# Patient Record
Sex: Male | Born: 1980
Health system: Southern US, Community
[De-identification: ages and names within clinical notes are randomized; demographics above are authoritative.]

## PROBLEM LIST (undated history)

## (undated) DIAGNOSIS — T7840XA Allergy, unspecified, initial encounter: Secondary | ICD-10-CM

## (undated) HISTORY — DX: Allergy, unspecified, initial encounter: T78.40XA

---

## 2003-05-18 ENCOUNTER — Emergency Department (HOSPITAL_COMMUNITY): Admission: EM | Admit: 2003-05-18 | Discharge: 2003-05-18 | Payer: Self-pay | Admitting: Emergency Medicine

## 2004-10-31 ENCOUNTER — Emergency Department (HOSPITAL_COMMUNITY): Admission: EM | Admit: 2004-10-31 | Discharge: 2004-10-31 | Payer: Self-pay | Admitting: Emergency Medicine

## 2007-09-22 ENCOUNTER — Emergency Department (HOSPITAL_COMMUNITY): Admission: EM | Admit: 2007-09-22 | Discharge: 2007-09-22 | Payer: Self-pay | Admitting: Family Medicine

## 2008-07-07 ENCOUNTER — Emergency Department (HOSPITAL_COMMUNITY): Admission: EM | Admit: 2008-07-07 | Discharge: 2008-07-07 | Payer: Self-pay | Admitting: Family Medicine

## 2010-02-14 ENCOUNTER — Emergency Department (HOSPITAL_COMMUNITY)
Admission: EM | Admit: 2010-02-14 | Discharge: 2010-02-14 | Disposition: A | Discharge status: Home / Self Care | Payer: Self-pay | Attending: Emergency Medicine | Admitting: Emergency Medicine

## 2010-02-14 DIAGNOSIS — J029 Acute pharyngitis, unspecified: Secondary | ICD-10-CM | POA: Insufficient documentation

## 2010-02-14 DIAGNOSIS — H9209 Otalgia, unspecified ear: Secondary | ICD-10-CM | POA: Insufficient documentation

## 2010-02-14 DIAGNOSIS — R509 Fever, unspecified: Secondary | ICD-10-CM | POA: Insufficient documentation

## 2010-02-14 DIAGNOSIS — R51 Headache: Secondary | ICD-10-CM | POA: Insufficient documentation

## 2010-02-14 DIAGNOSIS — H6690 Otitis media, unspecified, unspecified ear: Secondary | ICD-10-CM | POA: Insufficient documentation

## 2010-07-30 ENCOUNTER — Emergency Department (HOSPITAL_COMMUNITY)
Admission: EM | Admit: 2010-07-30 | Discharge: 2010-07-30 | Disposition: A | Discharge status: Home / Self Care | Payer: Self-pay | Attending: Emergency Medicine | Admitting: Emergency Medicine

## 2010-07-30 DIAGNOSIS — J020 Streptococcal pharyngitis: Secondary | ICD-10-CM | POA: Insufficient documentation

## 2010-07-30 DIAGNOSIS — J351 Hypertrophy of tonsils: Secondary | ICD-10-CM | POA: Insufficient documentation

## 2010-07-30 LAB — RAPID STREP SCREEN (MED CTR MEBANE ONLY): Streptococcus, Group A Screen (Direct): POSITIVE — AB

## 2013-09-12 ENCOUNTER — Emergency Department (HOSPITAL_COMMUNITY)
Admission: EM | Admit: 2013-09-12 | Discharge: 2013-09-12 | Disposition: A | Discharge status: Home / Self Care | Payer: No Typology Code available for payment source | Attending: Emergency Medicine | Admitting: Emergency Medicine

## 2013-09-12 ENCOUNTER — Encounter (HOSPITAL_COMMUNITY): Payer: Self-pay | Admitting: Emergency Medicine

## 2013-09-12 ENCOUNTER — Emergency Department (HOSPITAL_COMMUNITY): Payer: No Typology Code available for payment source

## 2013-09-12 DIAGNOSIS — F17200 Nicotine dependence, unspecified, uncomplicated: Secondary | ICD-10-CM | POA: Diagnosis not present

## 2013-09-12 DIAGNOSIS — Y9289 Other specified places as the place of occurrence of the external cause: Secondary | ICD-10-CM | POA: Diagnosis not present

## 2013-09-12 DIAGNOSIS — Y9389 Activity, other specified: Secondary | ICD-10-CM | POA: Insufficient documentation

## 2013-09-12 DIAGNOSIS — W1809XA Striking against other object with subsequent fall, initial encounter: Secondary | ICD-10-CM | POA: Insufficient documentation

## 2013-09-12 DIAGNOSIS — S20211A Contusion of right front wall of thorax, initial encounter: Secondary | ICD-10-CM

## 2013-09-12 DIAGNOSIS — S20219A Contusion of unspecified front wall of thorax, initial encounter: Secondary | ICD-10-CM | POA: Diagnosis not present

## 2013-09-12 DIAGNOSIS — S298XXA Other specified injuries of thorax, initial encounter: Secondary | ICD-10-CM | POA: Insufficient documentation

## 2013-09-12 MED ORDER — OXYCODONE-ACETAMINOPHEN 5-325 MG PO TABS
1.0000 | ORAL_TABLET | Freq: Once | ORAL | Status: AC
  Administered 2013-09-12: 1 via ORAL
  Filled 2013-09-12: qty 1

## 2013-09-12 MED ORDER — HYDROCODONE-ACETAMINOPHEN 5-325 MG PO TABS
1.0000 | ORAL_TABLET | Freq: Four times a day (QID) | ORAL | Status: DC | PRN
Start: 2013-09-12 — End: 2017-12-11

## 2013-09-12 MED ORDER — IBUPROFEN 800 MG PO TABS: 800.0000 mg | ORAL_TABLET | Freq: Three times a day (TID) | ORAL | Status: DC

## 2013-09-12 NOTE — ED Notes (Signed)
MD at bedside. 

## 2013-09-12 NOTE — ED Notes (Signed)
Pt discharged to home. D/c instructions given, rx's given, no questions verbalized. Vitals stable.

## 2013-09-12 NOTE — ED Notes (Signed)
He states he fell approx 18 feet off his 18 wheeler truck yesterday morning in [place]. He drove home from [place] and noticed increasing pain in his R rib cage since. He states it hurts worse every time he breathes. He denies any other injuries. hes ambulatory, mae, breathing easily

## 2013-09-12 NOTE — Progress Notes (Signed)
Orthopedic Tech Progress Note Patient Details:  Logan Clark 23-Jun-1980 [id number]  Ortho Devices Type of Ortho Device: Arm sling Ortho Device/Splint Location: rue Ortho Device/Splint Interventions: Application   Nikki Dom 09/12/2013, 2:58 PM

## 2013-09-12 NOTE — Discharge Instructions (Signed)
Ibuprofen and norco for pain. Rest. Ice. Use incentive spirometer every 2-3 hrs. Follow up with primary care doctor.   Rib Contusion A rib contusion (bruise) can occur by a blow to the chest or by a fall against a hard object. Usually these will be much better in a couple weeks. If X-rays were taken today and there are no broken bones (fractures), the diagnosis of bruising is made. However, broken ribs may not show up for several days, or may be discovered later on a routine X-ray when signs of healing show up. If this happens to you, it does not mean that something was missed on the X-ray, but simply that it did not show up on the first X-rays. Earlier diagnosis will not usually change the treatment. HOME CARE INSTRUCTIONS   Avoid strenuous activity. Be careful during activities and avoid bumping the injured ribs. Activities that pull on the injured ribs and cause pain should be avoided, if possible.  For the first day or two, an ice pack used every 20 minutes while awake may be helpful. Put ice in a plastic bag and put a towel between the bag and the skin.  Eat a normal, well-balanced diet. Drink plenty of fluids to avoid constipation.  Take deep breaths several times a day to keep lungs free of infection. Try to cough several times a day. Splint the injured area with a pillow while coughing to ease pain. Coughing can help prevent pneumonia.  Wear a rib belt or binder only if told to do so by your caregiver. If you are wearing a rib belt or binder, you must do the breathing exercises as directed by your caregiver. If not used properly, rib belts or binders restrict breathing which can lead to pneumonia.  Only take over-the-counter or prescription medicines for pain, discomfort, or fever as directed by your caregiver. SEEK MEDICAL CARE IF:   You or your child has an oral temperature above 102 F (38.9 C).  Your baby is older than 3 months with a rectal temperature of 100.5 F (38.1 C) or  higher for more than 1 day.  You develop a cough, with thick or bloody sputum. SEEK IMMEDIATE MEDICAL CARE IF:   You have difficulty breathing.  You feel sick to your stomach (nausea), have vomiting or belly (abdominal) pain.  You have worsening pain, not controlled with medications, or there is a change in the location of the pain.  You develop sweating or radiation of the pain into the arms, jaw or shoulders, or become light headed or faint.  You or your child has an oral temperature above 102 F (38.9 C), not controlled by medicine.  Your or your baby is older than 3 months with a rectal temperature of 102 F (38.9 C) or higher.  Your baby is 74 months old or younger with a rectal temperature of 100.4 F (38 C) or higher. MAKE SURE YOU:   Understand these instructions.  Will watch your condition.  Will get help right away if you are not doing well or get worse. Document Released: 09/12/2000 Document Revised: 04/14/2012 Document Reviewed: 08/06/2007 [facility] Patient Information 2015 [place], [place]. This information is not intended to replace advice given to you by your health care provider. Make sure you discuss any questions you have with your health care provider.

## 2013-09-12 NOTE — ED Provider Notes (Signed)
CSN: [id number]     Arrival date & time 09/12/13  1131 History   First MD Initiated Contact with Patient 09/12/13 1236     Chief Complaint  Patient presents with  . Fall     (Consider location/radiation/quality/duration/timing/severity/associated sxs/prior Treatment) HPI JONI NORROD is a 33 y.o. male who presents to ED with complaint of right chest injury. Pt states he was loading something on his tractor trailer when he slipped and fell forward, striking right chest on a metal bar that was on the ground. States fell about 8-10 ft. States this happened in [place], drove from [place] here. States pain is worsened with palpation of the chest, movement, deep breathing. Nothing makes it better. Did not take any medications. Denies shortness of breath, just states it is painful to breath.   History reviewed. No pertinent past medical history. History reviewed. No pertinent past surgical history. History reviewed. No pertinent family history. History  Substance Use Topics  . Smoking status: Current Every Day Smoker    Types: Cigarettes  . Smokeless tobacco: Not on file  . Alcohol Use: Yes    Review of Systems  Constitutional: Negative for fever and chills.  Respiratory: Negative for cough, chest tightness and shortness of breath.   Cardiovascular: Positive for chest pain. Negative for palpitations and leg swelling.  Gastrointestinal: Negative for nausea, vomiting, abdominal pain, diarrhea and abdominal distention.  Genitourinary: Negative for hematuria.  Musculoskeletal: Negative for arthralgias, myalgias, neck pain and neck stiffness.  Skin: Negative for rash.  Allergic/Immunologic: Negative for immunocompromised state.  Neurological: Negative for dizziness, weakness, light-headedness, numbness and headaches.      Allergies  Review of patient's allergies indicates no known allergies.  Home Medications   Prior to Admission medications   Not on File   BP 119/83  Pulse 89   Temp(Src) 97.5 F (36.4 C) (Axillary)  Resp 20  Ht  (1.778 m)  Wt 180 lb (81.647 kg)  BMI 25.83 kg/m2  SpO2 100% Physical Exam  Nursing note and vitals reviewed. Constitutional: He appears well-developed and well-nourished. No distress.  HENT:  Head: Normocephalic and atraumatic.  Eyes: Conjunctivae are normal.  Neck: Neck supple.  Cardiovascular: Normal rate, regular rhythm and normal heart sounds.   Pulmonary/Chest: Effort normal. No respiratory distress. He has no wheezes. He has no rales. He exhibits tenderness.  No bruising, swelling, deformity, crepitus over the chest wall. Tenderness over right upper anterior chest, over clavicle and right pectoralis muscle and ribs 1-5  Abdominal: Soft. Bowel sounds are normal. He exhibits no distension. There is no tenderness. There is no rebound.  Musculoskeletal: He exhibits no edema.  Neurological: He is alert.  Skin: Skin is warm and dry.    ED Course  Procedures (including critical care time) Labs Review Labs Reviewed - No data to display  Imaging Review Dg Ribs Unilateral W/chest Right  09/12/2013   CLINICAL DATA:  Right upper chest pain.  EXAM: RIGHT RIBS AND CHEST - 3+ VIEW  COMPARISON:  None  FINDINGS: No fracture or other bone lesions are seen involving the ribs. There is no evidence of pneumothorax or pleural effusion. Both lungs are clear. Heart size and mediastinal contours are within normal limits.  IMPRESSION: Negative.   Electronically Signed   By: Signa Kell M.D.   On: 09/12/2013 13:31     EKG Interpretation None      MDM   Final diagnoses:  Contusion of ribs, right, initial encounter    Patient with  right chest wall tenderness and pain after falling down on a metal bar from history of a truck. This happened yesterday. He drove himself from [place] after the injury. He is complaining of pain to that area, pain with deep breathing, movement, palpation. X-rays obtained of his right ribs and chest both  negative. Most likely chest wall contusion. Patient does have pain with movement of his right shoulder, suspect due to contusion to his pectoralis muscle. Placed into a sling. Home with ibuprofen, norco, PCP follow up. Incentive spirometer provided.    Filed Vitals:   09/12/13 1138 09/12/13 1523  BP: 119/83 114/71  Pulse: 89 47  Temp: 97.5 F (36.4 C) 97.7 F (36.5 C)  TempSrc: Axillary Oral  Resp: 20 18  Height:  (1.778 m)   Weight: 180 lb (81.647 kg)   SpO2: 100% 100%      Min Collymore A Deneane Stifter, PA-C 09/12/13 1604

## 2013-09-13 NOTE — ED Provider Notes (Signed)
Medical screening examination/treatment/procedure(s) were conducted as a shared visit with non-physician practitioner(s) and myself.  I personally evaluated the patient during the encounter.  Pt s/p fall, contusion to right chest wall. No sob. No abd pain. abd soft nt. Spine nt.    Suzi Roots, MD 09/13/13 [phone]

## 2015-09-24 IMAGING — CR DG RIBS W/ CHEST 3+V*R*
4 series · 4 of 4 positions shown · non-contrast
Comparison: None

CLINICAL DATA: Right upper chest pain.

EXAM:
RIGHT RIBS AND CHEST - 3+ VIEW

[w chest pa]
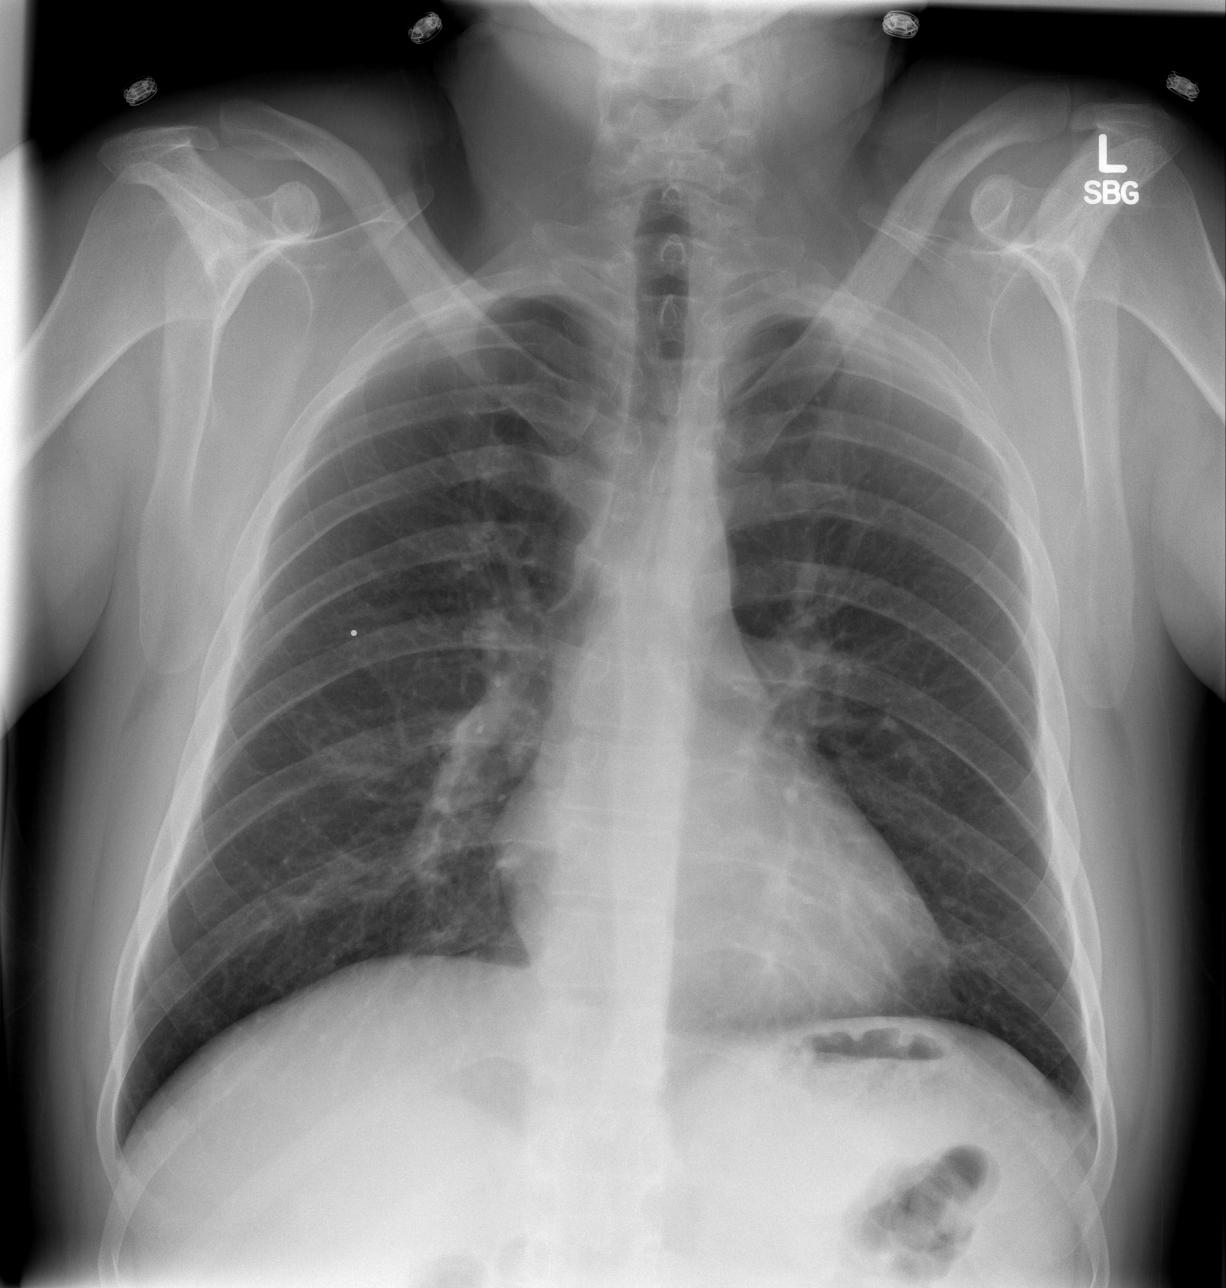

[w ribs ap/pa upper right]
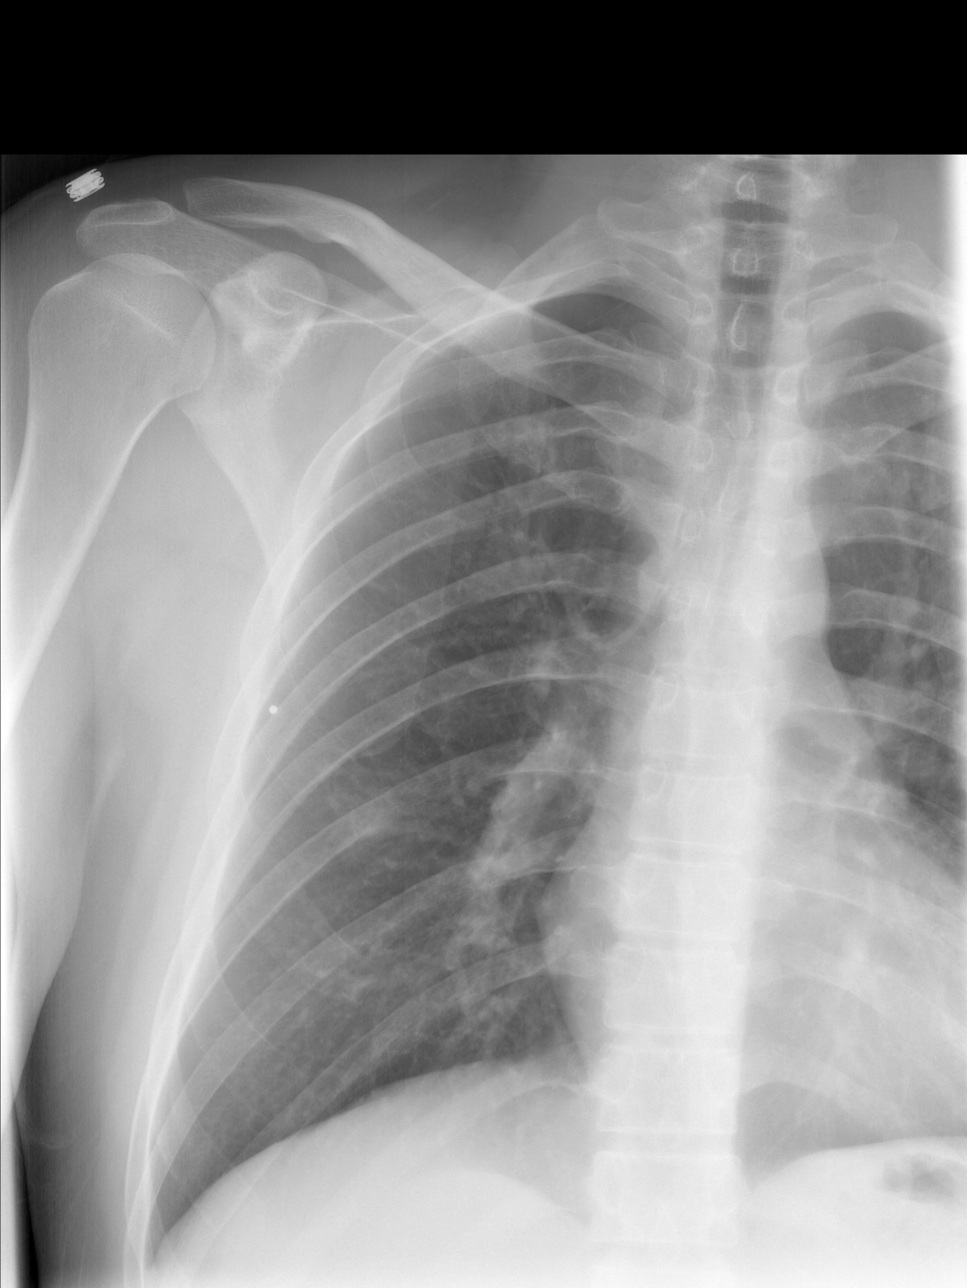

[w ribs ap/pa lower right]
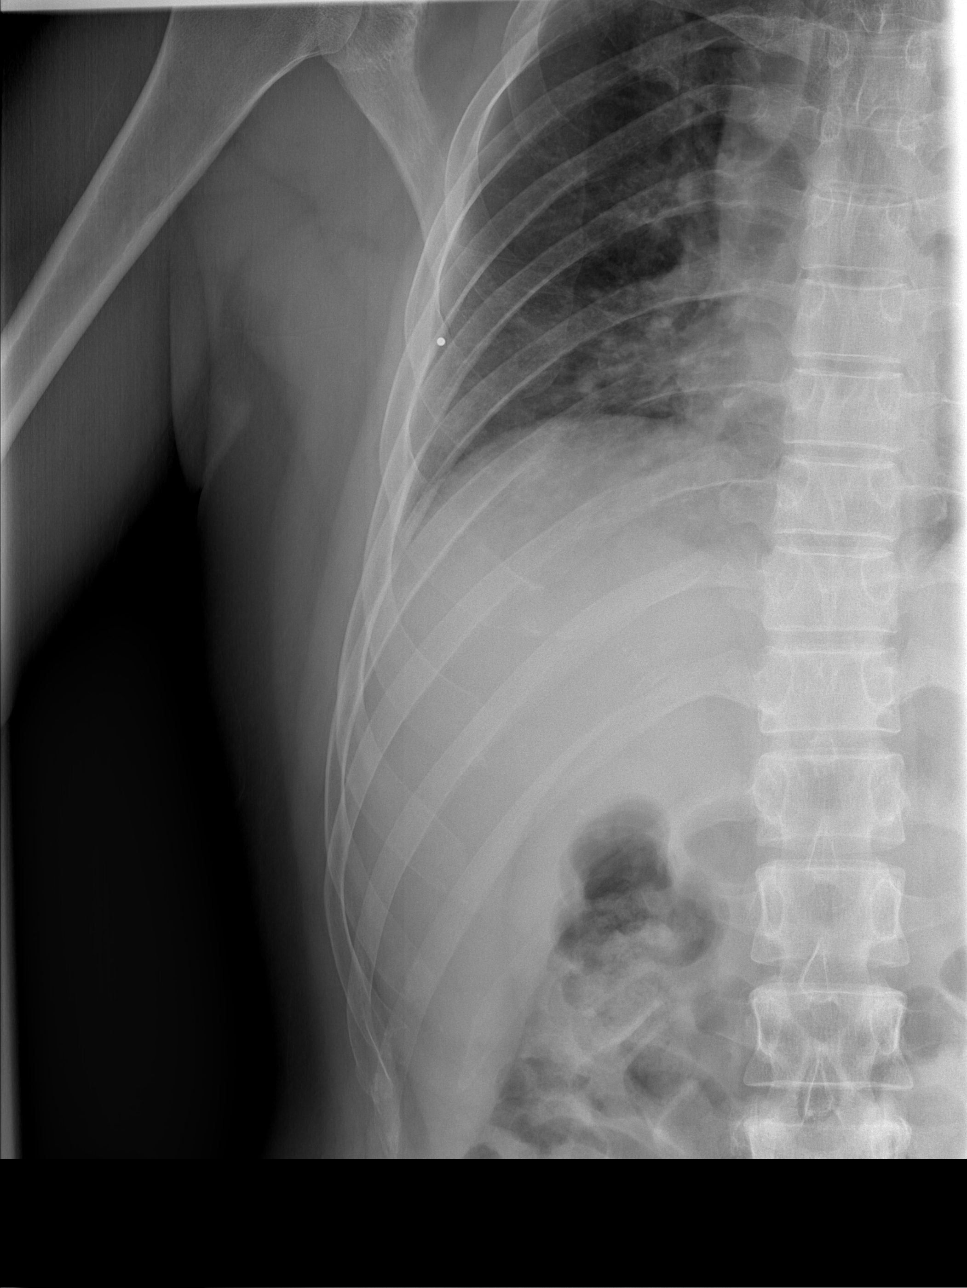

[w ribs oblique right]
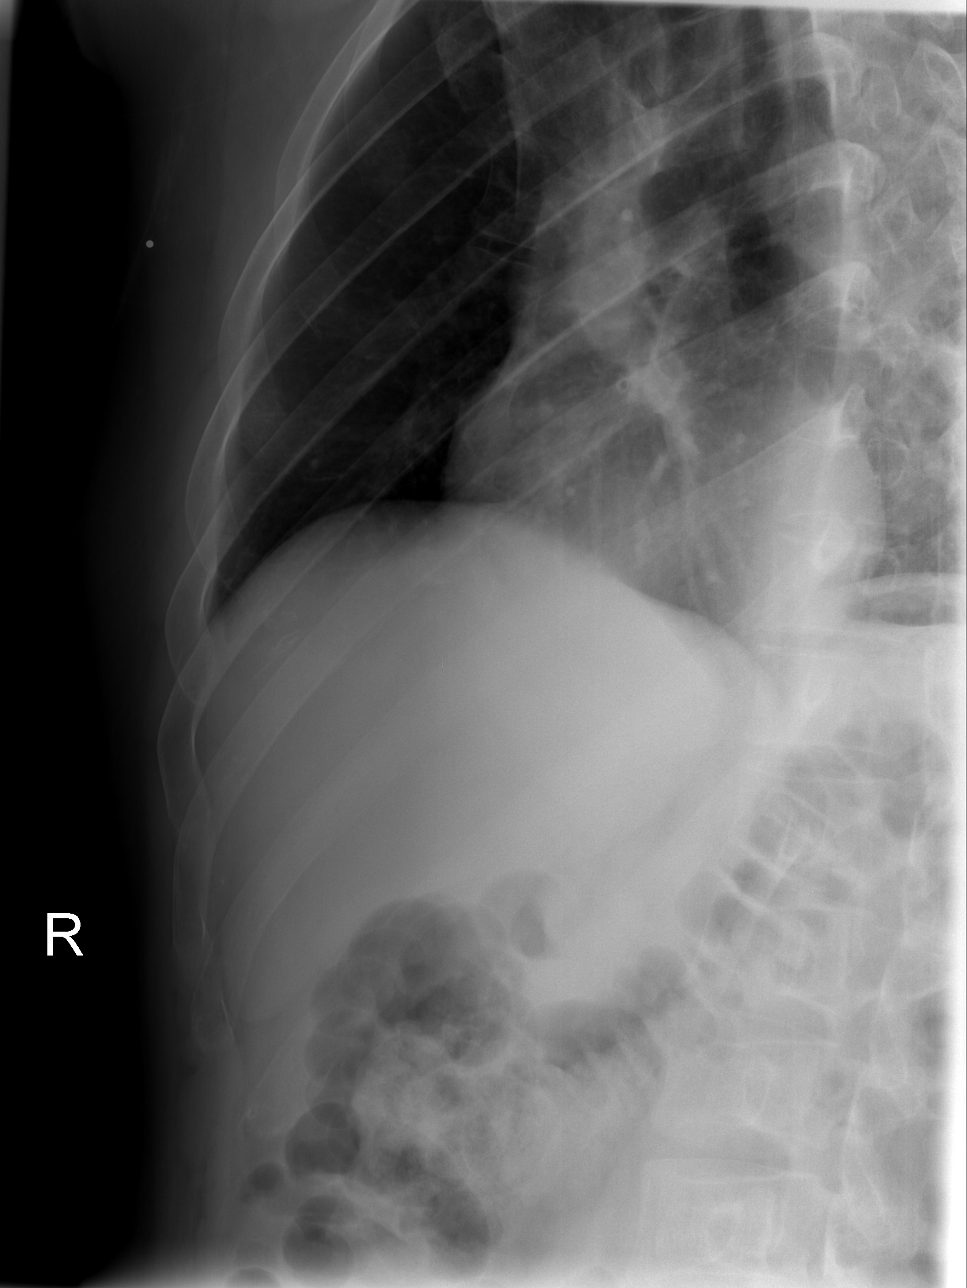

[4 of 4 positions shown; findings below may reference images not displayed]

FINDINGS: No fracture or other bone lesions are seen involving the ribs. There
is no evidence of pneumothorax or pleural effusion. Both lungs are
clear. Heart size and mediastinal contours are within normal limits.
IMPRESSION: Negative.

## 2017-12-11 ENCOUNTER — Ambulatory Visit (INDEPENDENT_AMBULATORY_CARE_PROVIDER_SITE_OTHER): Payer: 59 | Admitting: Family Medicine

## 2017-12-11 ENCOUNTER — Encounter: Payer: Self-pay | Admitting: Family Medicine

## 2017-12-11 ENCOUNTER — Telehealth: Payer: Self-pay

## 2017-12-11 VITALS — BP 138/82 | HR 64 | Temp 97.6°F | Ht 70.0 in | Wt 190.2 lb

## 2017-12-11 DIAGNOSIS — Z7689 Persons encountering health services in other specified circumstances: Secondary | ICD-10-CM

## 2017-12-11 DIAGNOSIS — G479 Sleep disorder, unspecified: Secondary | ICD-10-CM

## 2017-12-11 DIAGNOSIS — F17200 Nicotine dependence, unspecified, uncomplicated: Secondary | ICD-10-CM | POA: Diagnosis not present

## 2017-12-11 DIAGNOSIS — Z2821 Immunization not carried out because of patient refusal: Secondary | ICD-10-CM

## 2017-12-11 DIAGNOSIS — Z0000 Encounter for general adult medical examination without abnormal findings: Secondary | ICD-10-CM

## 2017-12-11 LAB — LIPID PANEL
Cholesterol: 160 mg/dL (ref 0–200)
HDL: 26.7 mg/dL — ABNORMAL LOW (ref >39.00)
LDL Cholesterol: 116 mg/dL — ABNORMAL HIGH (ref 0–99)
NonHDL: 132.89
Total CHOL/HDL Ratio: 6
Triglycerides: 85 mg/dL (ref 0.0–149.0)
VLDL: 17 mg/dL (ref 0.0–40.0)

## 2017-12-11 LAB — BASIC METABOLIC PANEL
BUN: 11 mg/dL (ref 6–23)
CO2: 29 mEq/L (ref 19–32)
Calcium: 9 mg/dL (ref 8.4–10.5)
Chloride: 105 mEq/L (ref 96–112)
Creatinine, Ser: 0.97 mg/dL (ref 0.40–1.50)
GFR: 92.4 mL/min (ref >60.00)
Glucose, Bld: 104 mg/dL — ABNORMAL HIGH (ref 70–99)
Potassium: 3.9 mEq/L (ref 3.5–5.1)
Sodium: 140 mEq/L (ref 135–145)

## 2017-12-11 LAB — ALT: ALT: 13 U/L (ref 0–53)

## 2017-12-11 LAB — AST: AST: 11 U/L (ref 0–37)

## 2017-12-11 MED ORDER — TRAZODONE HCL 50 MG PO TABS: 50.0000 mg | ORAL_TABLET | Freq: Every evening | ORAL | 1 refills | Status: DC | PRN

## 2017-12-11 NOTE — Telephone Encounter (Signed)
Copied from CRM [phone]. Topic: Quick Communication - See Telephone Encounter >> Dec 11, 2017  2:31 PM Waymon AmatoBurton, Donna F wrote: Pt returned call regard lab work no notes that labs had been reviewed yet  Best number [phone]

## 2017-12-11 NOTE — Progress Notes (Signed)
Logan JulianGeorge M Simerly is a 37 y.o. male  Chief Complaint  Patient presents with  . Establish Care    physical , trouble sleeping    HPI: Logan Clark is a 37 y.o. male here as a new patient to our office for his annual physical exam and fasting labs. He last ate 8 hrs ago  Pt states he has "a problem sleeping". Pt gets 5 hours of sleep per day Pt works nights and gets home around 6:30am, falls asleep around 10am and wakes up 2:30-3:30pm and is unable to get back to sleep. He works 10pm-6am x 17 years. Sleep has become an issue in the past 1 year or so. He states ambien 10mg  helps some (he has tried this from a friend). He has also tried melatonin and other OTC, without improvement  Last Colonoscopy: n/a  AAA screening: n/a  Diet/Exercise: no regular CV exercise; eats fast food 1-2x/day; drinks 4 16oz bottles of Dr. Reino KentPepper or 2 Monster energy drinks per day.  He declines the flu vaccine today.  Med refills needed today: none  Vision - pt has Rx for contacts but does not wear them and is not required to for his job Dental - overdue  Past Medical History:  Diagnosis Date  . Allergy     No past surgical history on file.  Social History   Socioeconomic History  . Marital status: Divorced    Spouse name: Not on file  . Number of children: Not on file  . Years of education: Not on file  . Highest education level: Not on file  Occupational History  . Not on file  Social Needs  . Financial resource strain: Not on file  . Food insecurity:    Worry: Not on file    Inability: Not on file  . Transportation needs:    Medical: Not on file    Non-medical: Not on file  Tobacco Use  . Smoking status: Current Every Day Smoker    Types: Cigarettes  . Smokeless tobacco: Never Used  Substance and Sexual Activity  . Alcohol use: Yes  . Drug use: Yes    Types: Marijuana  . Sexual activity: Not on file  Lifestyle  . Physical activity:    Days per week: Not on file    Minutes per  session: Not on file  . Stress: Not on file  Relationships  . Social connections:    Talks on phone: Not on file    Gets together: Not on file    Attends religious service: Not on file    Active member of club or organization: Not on file    Attends meetings of clubs or organizations: Not on file    Relationship status: Not on file  . Intimate partner violence:    Fear of current or ex partner: Not on file    Emotionally abused: Not on file    Physically abused: Not on file    Forced sexual activity: Not on file  Other Topics Concern  . Not on file  Social History Narrative  . Not on file    Family History  Problem Relation Age of Onset  . Diabetes Mother   . Cancer Father       There is no immunization history on file for this patient.  Outpatient Encounter Medications as of 12/11/2017  Medication Sig  . traZODone (DESYREL) 50 MG tablet Take 1 tablet (50 mg total) by mouth at bedtime as needed for  sleep.  . [DISCONTINUED] HYDROcodone-acetaminophen (NORCO) 5-325 MG per tablet Take 1 tablet by mouth every 6 (six) hours as needed for moderate pain. (Patient not taking: Reported on 12/11/2017)  . [DISCONTINUED] ibuprofen (ADVIL,MOTRIN) 800 MG tablet Take 1 tablet (800 mg total) by mouth 3 (three) times daily. (Patient not taking: Reported on 12/11/2017)   No facility-administered encounter medications on file as of 12/11/2017.      ROS: Gen: no fever, chills  Skin: no rash, itching ENT: no ear pain, ear drainage, nasal congestion, rhinorrhea, sinus pressure, sore throat Eyes: no blurry vision, double vision Resp: no cough, wheeze,SOB CV: no CP, palpitations, LE edema,  GI: no heartburn, n/v/d/c, abd pain GU: no dysuria, urgency, frequency, hematuria; no testicular swelling or masses MSK: no joint pain, myalgias, back pain Neuro: no dizziness, headache, weakness, vertigo Psych: no depression, anxiety; + insomnia, trouble sleeping   No Known Allergies  BP 138/82 (BP  Location: Left Arm, Patient Position: Sitting, Cuff Size: Normal)   Pulse 64   Temp 97.6 F (36.4 C) (Oral)   Ht 5\' 10"  (1.778 m)   Wt 190 lb 3.2 oz (86.3 kg)   SpO2 98%   BMI 27.29 kg/m   BP Readings from Last 3 Encounters:  12/11/17 138/82  09/12/13 114/71    Physical Exam  Constitutional: He is oriented to person, place, and time. He appears well-developed and well-nourished. No distress.  HENT:  Head: Normocephalic and atraumatic.  Right Ear: Tympanic membrane, external ear and ear canal normal.  Left Ear: Tympanic membrane, external ear and ear canal normal.  Nose: Nose normal.  Mouth/Throat: Oropharynx is clear and moist. No oropharyngeal exudate.  Eyes: Pupils are equal, round, and reactive to light. Conjunctivae are normal.  Neck: Neck supple. No JVD present. No thyromegaly present.  Cardiovascular: Normal rate, regular rhythm, normal heart sounds and intact distal pulses.  No murmur heard. Pulmonary/Chest: Breath sounds normal. No respiratory distress. He has no wheezes. He has no rhonchi.  Abdominal: Soft. Bowel sounds are normal. He exhibits no distension. There is no tenderness. There is no rebound and no guarding.  Musculoskeletal: Normal range of motion. He exhibits no edema.  Lymphadenopathy:    He has no cervical adenopathy.  Neurological: He is alert and oriented to person, place, and time. Coordination normal.  Skin: Skin is warm and dry.  Psychiatric: He has a normal mood and affect. His behavior is normal.     A/P:  1. Encounter to establish care with new doctor  2. Annual physical exam - declines flu vaccine - overdue for dental and vision exams - discussed importance of limiting caffeine, alternatives to eating take-out food but pt does not seem motivated to make any changes to his diet or activity level (no regular CV exercise) - ALT - AST - Basic metabolic panel - Lipid panel - next CPE in 1 year or sooner PRN  3. Sleeping difficulty -  discussed importance of limiting caffeine, especially within hours of bedtime Rx: - traZODone (DESYREL) 50 MG tablet; Take 1 tablet (50 mg total) by mouth at bedtime as needed for sleep.  Dispense: 60 tablet; Refill: 1 - f/u PRN  4. Tobacco use disorder - pt is an everyday smoker x years and does not have a strong interest in quitting or cutting back, despite acknowledging it is bad for him   5. Influenza vaccination declined by patient  Discussed plan and reviewed medications with patient, including risks, benefits, and potential side effects. Pt expressed  understand. All questions answered.

## 2018-01-28 ENCOUNTER — Ambulatory Visit: Payer: BLUE CROSS/BLUE SHIELD | Admitting: Nurse Practitioner

## 2018-01-28 ENCOUNTER — Encounter: Payer: Self-pay | Admitting: Nurse Practitioner

## 2018-01-28 VITALS — BP 114/74 | HR 85 | Temp 99.4°F | Ht 70.0 in | Wt 186.0 lb

## 2018-01-28 DIAGNOSIS — B349 Viral infection, unspecified: Secondary | ICD-10-CM

## 2018-01-28 DIAGNOSIS — R6889 Other general symptoms and signs: Secondary | ICD-10-CM | POA: Diagnosis not present

## 2018-01-28 DIAGNOSIS — J9801 Acute bronchospasm: Secondary | ICD-10-CM | POA: Diagnosis not present

## 2018-01-28 LAB — POCT INFLUENZA A/B
Influenza A, POC: NEGATIVE
Influenza B, POC: NEGATIVE

## 2018-01-28 MED ORDER — ALBUTEROL SULFATE HFA 108 (90 BASE) MCG/ACT IN AERS: 1.0000 | INHALATION_SPRAY | Freq: Four times a day (QID) | RESPIRATORY_TRACT | 0 refills | Status: DC | PRN

## 2018-01-28 MED ORDER — ALBUTEROL SULFATE (2.5 MG/3ML) 0.083% IN NEBU
2.5000 mg | INHALATION_SOLUTION | Freq: Once | RESPIRATORY_TRACT | Status: AC
  Administered 2018-01-28: 2.5 mg via RESPIRATORY_TRACT

## 2018-01-28 MED ORDER — HYDROCODONE-HOMATROPINE 5-1.5 MG/5ML PO SYRP: 5.0000 mL | ORAL_SOLUTION | Freq: Two times a day (BID) | ORAL | 0 refills | Status: DC | PRN

## 2018-01-28 MED ORDER — GUAIFENESIN ER 600 MG PO TB12: 600.0000 mg | ORAL_TABLET | Freq: Two times a day (BID) | ORAL | 0 refills | Status: DC | PRN

## 2018-01-28 MED ORDER — OSELTAMIVIR PHOSPHATE 75 MG PO CAPS: 75.0000 mg | ORAL_CAPSULE | Freq: Two times a day (BID) | ORAL | 0 refills | Status: DC

## 2018-01-28 MED ORDER — CHLORPHEN-PE-ACETAMINOPHEN 4-10-325 MG PO TABS: 1.0000 | ORAL_TABLET | Freq: Two times a day (BID) | ORAL | 0 refills | Status: AC

## 2018-01-28 NOTE — Progress Notes (Signed)
Subjective:  Patient ID: Logan Clark, male    DOB: 08/19/1980  Age: 38 y.o. MRN: [id number]  CC: Cough (pt is complaining of coughing,bodyache,pull muscus on right leg from coughing. 1 day. )   URI   This is a new problem. The current episode started today. The problem has been unchanged. The maximum temperature recorded prior to his arrival was 100.4 - 100.9 F. Associated symptoms include congestion, coughing, ear pain, headaches, rhinorrhea, sinus pain, a sore throat and wheezing. He has tried decongestant for the symptoms. The treatment provided no relief.  everyday tobacco use. Contact with sick mother, unsure if she has flu. Does not take influenza vaccine.  Reviewed past Medical, Social and Family history today.  Outpatient Medications Prior to Visit  Medication Sig Dispense Refill  . traZODone (DESYREL) 50 MG tablet Take 1 tablet (50 mg total) by mouth at bedtime as needed for sleep. 60 tablet 1   No facility-administered medications prior to visit.     ROS See HPI  Objective:  BP 114/74   Pulse 85   Temp 99.4 F (37.4 C) (Oral)   Ht 5\' 10"  (1.778 m)   Wt 186 lb (84.4 kg)   SpO2 96%   BMI 26.69 kg/m   BP Readings from Last 3 Encounters:  01/28/18 114/74  12/11/17 138/82  09/12/13 114/71    Wt Readings from Last 3 Encounters:  01/28/18 186 lb (84.4 kg)  12/11/17 190 lb 3.2 oz (86.3 kg)  09/12/13 180 lb (81.6 kg)    Physical Exam Vitals signs reviewed.  Constitutional:      General: He is not in acute distress. HENT:     Right Ear: Tympanic membrane, ear canal and external ear normal.     Left Ear: Tympanic membrane and ear canal normal.     Nose: Mucosal edema and rhinorrhea present.     Right Sinus: Maxillary sinus tenderness and frontal sinus tenderness present.     Left Sinus: Maxillary sinus tenderness and frontal sinus tenderness present.     Mouth/Throat:     Pharynx: Uvula midline. Posterior oropharyngeal erythema present. No oropharyngeal  exudate.  Eyes:     General: No scleral icterus. Neck:     Musculoskeletal: Normal range of motion and neck supple.  Cardiovascular:     Rate and Rhythm: Normal rate and regular rhythm.  Pulmonary:     Effort: Pulmonary effort is normal.     Breath sounds: Wheezing present.  Lymphadenopathy:     Cervical: Cervical adenopathy present.  Neurological:     Mental Status: He is alert and oriented to person, place, and time.     Lab Results  Component Value Date   GLUCOSE 104 (H) 12/11/2017   CHOL 160 12/11/2017   TRIG 85.0 12/11/2017   HDL 26.70 (L) 12/11/2017   LDLCALC 116 (H) 12/11/2017   ALT 13 12/11/2017   AST 11 12/11/2017   NA 140 12/11/2017   K 3.9 12/11/2017   CL 105 12/11/2017   CREATININE 0.97 12/11/2017   BUN 11 12/11/2017   CO2 29 12/11/2017   Dg Ribs Unilateral W/chest Right  Result Date: 09/12/2013 CLINICAL DATA:  Right upper chest pain. EXAM: RIGHT RIBS AND CHEST - 3+ VIEW COMPARISON:  None FINDINGS: No fracture or other bone lesions are seen involving the ribs. There is no evidence of pneumothorax or pleural effusion. Both lungs are clear. Heart size and mediastinal contours are within normal limits. IMPRESSION: Negative. Electronically Signed   By:  Signa Kellaylor  Stroud M.D.   On: 09/12/2013 13:31    Assessment & Plan:   Greggory StallionGeorge was seen today for cough.  Diagnoses and all orders for this visit:  Flu-like symptoms -     oseltamivir (TAMIFLU) 75 MG capsule; Take 1 capsule (75 mg total) by mouth 2 (two) times daily. -     HYDROcodone-homatropine (HYCODAN) 5-1.5 MG/5ML syrup; Take 5 mLs by mouth every 12 (twelve) hours as needed. -     Chlorphen-PE-Acetaminophen 4-10-325 MG TABS; Take 1 tablet by mouth every 12 (twelve) hours for 3 days. -     guaiFENesin (MUCINEX) 600 MG 12 hr tablet; Take 1 tablet (600 mg total) by mouth 2 (two) times daily as needed for cough or to loosen phlegm. -     albuterol (PROVENTIL HFA;VENTOLIN HFA) 108 (90 Base) MCG/ACT inhaler; Inhale  1-2 puffs into the lungs every 6 (six) hours as needed. -     POCT Influenza A/B -     albuterol (PROVENTIL) (2.5 MG/3ML) 0.083% nebulizer solution 2.5 mg  Acute bronchospasm due to viral infection -     HYDROcodone-homatropine (HYCODAN) 5-1.5 MG/5ML syrup; Take 5 mLs by mouth every 12 (twelve) hours as needed. -     guaiFENesin (MUCINEX) 600 MG 12 hr tablet; Take 1 tablet (600 mg total) by mouth 2 (two) times daily as needed for cough or to loosen phlegm. -     albuterol (PROVENTIL HFA;VENTOLIN HFA) 108 (90 Base) MCG/ACT inhaler; Inhale 1-2 puffs into the lungs every 6 (six) hours as needed. -     albuterol (PROVENTIL) (2.5 MG/3ML) 0.083% nebulizer solution 2.5 mg   I am having Melrose NakayamaGeorge M. Earl LitesGregory start on oseltamivir, HYDROcodone-homatropine, Chlorphen-PE-Acetaminophen, guaiFENesin, and albuterol. I am also having him maintain his traZODone. We administered albuterol.  Meds ordered this encounter  Medications  . oseltamivir (TAMIFLU) 75 MG capsule    Sig: Take 1 capsule (75 mg total) by mouth 2 (two) times daily.    Dispense:  10 capsule    Refill:  0    Order Specific Question:   Supervising Provider    Answer:   Clare GandySCHMITZ, JEREMY E [[id number]]  . HYDROcodone-homatropine (HYCODAN) 5-1.5 MG/5ML syrup    Sig: Take 5 mLs by mouth every 12 (twelve) hours as needed.    Dispense:  60 mL    Refill:  0    Order Specific Question:   Supervising Provider    Answer:   SCHMITZ, JEREMY E [[id number]]  . Chlorphen-PE-Acetaminophen 4-10-325 MG TABS    Sig: Take 1 tablet by mouth every 12 (twelve) hours for 3 days.    Dispense:  6 tablet    Refill:  0    Order Specific Question:   Supervising Provider    Answer:   SCHMITZ, JEREMY E [[id number]]  . guaiFENesin (MUCINEX) 600 MG 12 hr tablet    Sig: Take 1 tablet (600 mg total) by mouth 2 (two) times daily as needed for cough or to loosen phlegm.    Dispense:  14 tablet    Refill:  0    Order Specific Question:   Supervising Provider    Answer:   Clare GandySCHMITZ, JEREMY E  [[id number]]  . albuterol (PROVENTIL HFA;VENTOLIN HFA) 108 (90 Base) MCG/ACT inhaler    Sig: Inhale 1-2 puffs into the lungs every 6 (six) hours as needed.    Dispense:  1 Inhaler    Refill:  0    Order Specific Question:   Supervising Provider    Answer:  SCHMITZ, JEREMY E [[id number]]  . albuterol (PROVENTIL) (2.5 MG/3ML) 0.083% nebulizer solution 2.5 mg    Problem List Items Addressed This Visit    None    Visit Diagnoses    Flu-like symptoms    -  Primary   Relevant Medications   oseltamivir (TAMIFLU) 75 MG capsule   HYDROcodone-homatropine (HYCODAN) 5-1.5 MG/5ML syrup   Chlorphen-PE-Acetaminophen 4-10-325 MG TABS   guaiFENesin (MUCINEX) 600 MG 12 hr tablet   albuterol (PROVENTIL HFA;VENTOLIN HFA) 108 (90 Base) MCG/ACT inhaler   albuterol (PROVENTIL) (2.5 MG/3ML) 0.083% nebulizer solution 2.5 mg (Completed)   Other Relevant Orders   POCT Influenza A/B (Completed)   Acute bronchospasm due to viral infection       Relevant Medications   oseltamivir (TAMIFLU) 75 MG capsule   HYDROcodone-homatropine (HYCODAN) 5-1.5 MG/5ML syrup   guaiFENesin (MUCINEX) 600 MG 12 hr tablet   albuterol (PROVENTIL HFA;VENTOLIN HFA) 108 (90 Base) MCG/ACT inhaler   albuterol (PROVENTIL) (2.5 MG/3ML) 0.083% nebulizer solution 2.5 mg (Completed)       Follow-up: No follow-ups on file.  Alysia Penna, NP

## 2018-01-28 NOTE — Patient Instructions (Signed)
Maintain adequate oral hydration  Upper Respiratory Infection, Adult An upper respiratory infection (URI) affects the nose, throat, and upper air passages. URIs are caused by germs (viruses). The most common type of URI is often called "the common cold." Medicines cannot cure URIs, but you can do things at home to relieve your symptoms. URIs usually get better within 7-10 days. Follow these instructions at home: Activity  Rest as needed.  If you have a fever, stay home from work or school until your fever is gone, or until your doctor says you may return to work or school. ? You should stay home until you cannot spread the infection anymore (you are not contagious). ? Your doctor may have you wear a face mask so you have less risk of spreading the infection. Relieving symptoms  Gargle with a salt-water mixture 3-4 times a day or as needed. To make a salt-water mixture, completely dissolve -1 tsp of salt in 1 cup of warm water.  Use a cool-mist humidifier to add moisture to the air. This can help you breathe more easily. Eating and drinking   Drink enough fluid to keep your pee (urine) pale yellow.  Eat soups and other clear broths. General instructions   Take over-the-counter and prescription medicines only as told by your doctor. These include cold medicines, fever reducers, and cough suppressants.  Do not use any products that contain nicotine or tobacco. These include cigarettes and e-cigarettes. If you need help quitting, ask your doctor.  Avoid being where people are smoking (avoid secondhand smoke).  Make sure you get regular shots and get the flu shot every year.  Keep all follow-up visits as told by your doctor. This is important. How to avoid spreading infection to others   Wash your hands often with soap and water. If you do not have soap and water, use hand sanitizer.  Avoid touching your mouth, face, eyes, or nose.  Cough or sneeze into a tissue or your sleeve  or elbow. Do not cough or sneeze into your hand or into the air. Contact a doctor if:  You are getting worse, not better.  You have any of these: ? A fever. ? Chills. ? Brown or red mucus in your nose. ? Yellow or brown fluid (discharge)coming from your nose. ? Pain in your face, especially when you bend forward. ? Swollen neck glands. ? Pain with swallowing. ? White areas in the back of your throat. Get help right away if:  You have shortness of breath that gets worse.  You have very bad or constant: ? Headache. ? Ear pain. ? Pain in your forehead, behind your eyes, and over your cheekbones (sinus pain). ? Chest pain.  You have long-lasting (chronic) lung disease along with any of these: ? Wheezing. ? Long-lasting cough. ? Coughing up blood. ? A change in your usual mucus.  You have a stiff neck.  You have changes in your: ? Vision. ? Hearing. ? Thinking. ? Mood. Summary  An upper respiratory infection (URI) is caused by a germ called a virus. The most common type of URI is often called "the common cold."  URIs usually get better within 7-10 days.  Take over-the-counter and prescription medicines only as told by your doctor. This information is not intended to replace advice given to you by your health care provider. Make sure you discuss any questions you have with your health care provider. Document Released: 06/06/2007 Document Revised: 08/10/2016 Document Reviewed: 08/10/2016 Elsevier Interactive Patient  Education  2019 [employer].

## 2018-01-29 ENCOUNTER — Encounter: Payer: Self-pay | Admitting: Nurse Practitioner

## 2022-07-22 ENCOUNTER — Other Ambulatory Visit: Payer: Self-pay

## 2022-07-22 ENCOUNTER — Emergency Department
Admission: EM | Admit: 2022-07-22 | Discharge: 2022-07-22 | Disposition: A | Discharge status: Home / Self Care | Payer: Medicaid Other | Attending: Emergency Medicine | Admitting: Emergency Medicine

## 2022-07-22 ENCOUNTER — Encounter: Payer: Self-pay | Admitting: Emergency Medicine

## 2022-07-22 ENCOUNTER — Emergency Department: Payer: Medicaid Other

## 2022-07-22 DIAGNOSIS — Z1152 Encounter for screening for COVID-19: Secondary | ICD-10-CM | POA: Insufficient documentation

## 2022-07-22 DIAGNOSIS — J181 Lobar pneumonia, unspecified organism: Secondary | ICD-10-CM | POA: Insufficient documentation

## 2022-07-22 DIAGNOSIS — J189 Pneumonia, unspecified organism: Secondary | ICD-10-CM

## 2022-07-22 DIAGNOSIS — R059 Cough, unspecified: Secondary | ICD-10-CM | POA: Diagnosis present

## 2022-07-22 LAB — CBC
HCT: 41.7 % (ref 39.0–52.0)
Hemoglobin: 15.6 g/dL (ref 13.0–17.0)
MCH: 31.8 pg (ref 26.0–34.0)
MCHC: 37.4 g/dL — ABNORMAL HIGH (ref 30.0–36.0)
MCV: 85.1 fL (ref 80.0–100.0)
Platelets: 202 10*3/uL (ref 150–400)
RBC: 4.9 MIL/uL (ref 4.22–5.81)
RDW: 12.5 % (ref 11.5–15.5)
WBC: 6.5 10*3/uL (ref 4.0–10.5)
nRBC: 0 % (ref 0.0–0.2)

## 2022-07-22 LAB — RESP PANEL BY RT-PCR (FLU A&B, COVID) ARPGX2
Influenza A by PCR: NEGATIVE
Influenza B by PCR: NEGATIVE
SARS Coronavirus 2 by RT PCR: NEGATIVE

## 2022-07-22 LAB — HEPATIC FUNCTION PANEL
ALT: 63 U/L — ABNORMAL HIGH (ref 0–44)
AST: 63 U/L — ABNORMAL HIGH (ref 15–41)
Albumin: 4.1 g/dL (ref 3.5–5.0)
Alkaline Phosphatase: 130 U/L — ABNORMAL HIGH (ref 38–126)
Bilirubin, Direct: 0.5 mg/dL — ABNORMAL HIGH (ref 0.0–0.2)
Indirect Bilirubin: 1.1 mg/dL — ABNORMAL HIGH (ref 0.3–0.9)
Total Bilirubin: 1.6 mg/dL — ABNORMAL HIGH (ref 0.3–1.2)
Total Protein: 7.8 g/dL (ref 6.5–8.1)

## 2022-07-22 LAB — BASIC METABOLIC PANEL
Anion gap: 14 (ref 5–15)
BUN: 11 mg/dL (ref 6–20)
CO2: 21 mmol/L — ABNORMAL LOW (ref 22–32)
Calcium: 8.6 mg/dL — ABNORMAL LOW (ref 8.9–10.3)
Chloride: 94 mmol/L — ABNORMAL LOW (ref 98–111)
Creatinine, Ser: 1.23 mg/dL (ref 0.61–1.24)
GFR, Estimated: >60 mL/min (ref >60)
Glucose, Bld: 123 mg/dL — ABNORMAL HIGH (ref 70–99)
Potassium: 3.6 mmol/L (ref 3.5–5.1)
Sodium: 129 mmol/L — ABNORMAL LOW (ref 135–145)

## 2022-07-22 MED ORDER — ACETAMINOPHEN 500 MG PO TABS
1000.0000 mg | ORAL_TABLET | ORAL | Status: AC
  Administered 2022-07-22: 1000 mg via ORAL
  Filled 2022-07-22: qty 2

## 2022-07-22 MED ORDER — AMOXICILLIN 500 MG PO CAPS: 1000.0000 mg | ORAL_CAPSULE | Freq: Three times a day (TID) | ORAL | 0 refills | Status: DC

## 2022-07-22 MED ORDER — DOXYCYCLINE HYCLATE 100 MG PO CAPS: 100.0000 mg | ORAL_CAPSULE | Freq: Two times a day (BID) | ORAL | 0 refills | Status: DC

## 2022-07-22 MED ORDER — ONDANSETRON 4 MG PO TBDP: 4.0000 mg | ORAL_TABLET | Freq: Four times a day (QID) | ORAL | 0 refills | Status: DC | PRN

## 2022-07-22 MED ORDER — SODIUM CHLORIDE 0.9 % IV BOLUS
1000.0000 mL | Freq: Once | INTRAVENOUS | Status: AC
  Administered 2022-07-22: 1000 mL via INTRAVENOUS

## 2022-07-22 NOTE — ED Triage Notes (Signed)
Pt states coming in with a fever since Thursday. Pt states some nausea and vomiting as well. Pt states temperature at home 157f this AM, took motrin at 1030am and then came to the ED.  Respiratory swab sent to lab as extra, if ordered.

## 2022-07-22 NOTE — ED Provider Notes (Signed)
[facility] Provider Note    Event Date/Time   First MD Initiated Contact with Patient 07/22/22 1222     (approximate)   History   Fever   HPI  Logan Clark is a 42 y.o. male ports no major medical illness  For about 4 to 5 days now has been experiencing mild body aches joint aches, fever mostly in the mornings, fatigue.  He had a very slight cough but no shortness of breath.  He also reports some nausea no diarrhea.  Denies abdominal pain reports decreased appetite  Has not noticed a rash.  Does also report a mild throbbing headache no neck pain.  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  Not noticed any tick bites or insect bites that he knows of but reports that he will frequenly encounter outside     Physical Exam   Triage Vital Signs: ED Triage Vitals  Encounter Vitals Group     BP 07/22/22 1153 (!) 135/103     Systolic BP Percentile --      Diastolic BP Percentile --      Pulse Rate 07/22/22 1153 90     Resp 07/22/22 1153 (!) 22     Temp 07/22/22 1153 99.2 F (37.3 C)     Temp Source 07/22/22 1153 Oral     SpO2 07/22/22 1153 98 %     Weight 07/22/22 1154 200 lb (90.7 kg)     Height 07/22/22 1154 5\' 10"  (1.778 m)     Head Circumference --      Peak Flow --      Pain Score 07/22/22 1154 10     Pain Loc --      Pain Education --      Exclude from Growth Chart --     Most recent vital signs: Vitals:   07/22/22 1153  BP: (!) 135/103  Pulse: 90  Resp: (!) 22  Temp: 99.2 F (37.3 C)  SpO2: 98%     General: Awake, no distress.  Very pleasant.  Mucous membranes are somewhat dry patient reports decreased appetite for the last several days CV:  Good peripheral perfusion.  Normal tones and rate Resp:  Normal effort.  Clear over the left, slightly rhonchorous over the right midlung field.  No wheezing.  Work of breathing is normal. Posterior oropharynx is normal.  The  tongue appears slightly dry Abd:  No distention.  Soft nontender nondistended throughout no pain McBurney's point negative Eulah Pont Other:  Carefully examined skin including hands palms thighs back neck, etc. no tick bites no rashes.  Normal appearance of skin   ED Results / Procedures / Treatments   Labs (all labs ordered are listed, but only abnormal results are displayed) Labs Reviewed  CBC - Abnormal; Notable for the following components:      Result Value   MCHC 37.4 (*)    All other components within normal limits  BASIC METABOLIC PANEL - Abnormal; Notable for the following components:   Sodium 129 (*)    Chloride 94 (*)    CO2 21 (*)    Glucose, Bld 123 (*)    Calcium 8.6 (*)    All other components within normal limits  HEPATIC FUNCTION PANEL - Abnormal; Notable for the following components:   AST 63 (*)    ALT 63 (*)    Alkaline Phosphatase 130 (*)    Total Bilirubin  1.6 (*)    Bilirubin, Direct 0.5 (*)    Indirect Bilirubin 1.1 (*)    All other components within normal limits  RESP PANEL BY RT-PCR (FLU A&B, COVID) ARPGX2  ROCKY MTN SPOTTED FVR ABS PNL(IGG+IGM)   All labs are notable for mild hyponatremia.  In the setting of his presentation with decreased appetite this may be secondary to hypobulimic causation, but also tickborne illness that is associated with potential early hyponatremia.  Something for consideration  EKG     RADIOLOGY Chest x-ray interpreted by me as right-sided lower lung infiltrative finding  PROCEDURES:  Critical Care performed: No  Procedures   MEDICATIONS ORDERED IN ED: Medications  sodium chloride 0.9 % bolus 1,000 mL (1,000 mLs Intravenous New Bag/Given 07/22/22 1326)  acetaminophen (TYLENOL) tablet 1,000 mg (1,000 mg Oral Given 07/22/22 1326)     IMPRESSION / MDM / ASSESSMENT AND PLAN / ED COURSE  I reviewed the triage vital signs and the nursing notes.                              Differential diagnosis includes, but is  not limited to, possible viral illness, tickborne illness, pneumonia given associated cough and right-sided lung findings, etc.  Differential diagnosis quite broad but he is overall well-appearing without hypoxia or difficulty breathing.  His lab works do demonstrate mild hyponatremia which we are repleting with fluids and suspect is likely due to hypovolemia, also he has mild very mild transaminitis suggestive of possible chronic underlying liver disease.  He does not smoke but does vape.  Counseled on this.  Note she had no recent antibiotic use, no recent hospitalizations, does not work in healthcare.  Patient's presentation is most consistent with acute complicated illness / injury requiring diagnostic workup.   ----------------------------------------- 1:36 PM on 07/22/2022 ----------------------------------------- Patient awake alert well-oriented.  Discussed concern for pneumonia.  Treatment recommendations and follow-up as well as careful return precautions.  Patient is agreeable with this  I will prescribe him amoxicillin plus doxycycline in the event of possible tick or tick borne illness given his exposure risk instead of azithromycin, and also Zofran ODT if needed for potential GI upset associated with doxycycline.   Return precautions and treatment recommendations and follow-up discussed with the patient who is agreeable with the plan.  Vitals:   07/22/22 1153  BP: (!) 135/103  Pulse: 90  Resp: (!) 22  Temp: 99.2 F (37.3 C)  SpO2: 98%         FINAL CLINICAL IMPRESSION(S) / ED DIAGNOSES   Final diagnoses:  Community acquired pneumonia of right lower lobe of lung     Rx / DC Orders   ED Discharge Orders          Ordered    amoxicillin (AMOXIL) 500 MG capsule  3 times daily        07/22/22 1340    doxycycline (VIBRAMYCIN) 100 MG capsule  2 times daily        07/22/22 1340    ondansetron (ZOFRAN-ODT) 4 MG disintegrating tablet  Every 6 hours PRN         07/22/22 1340             Note:  This document was prepared using Dragon voice recognition software and may include unintentional dictation errors.   Sharyn Creamer, MD 07/22/22 1340

## 2022-07-24 ENCOUNTER — Inpatient Hospital Stay
Admission: EM | Admit: 2022-07-24 | Discharge: 2022-07-26 | DRG: 194 | Disposition: A | Discharge status: Home / Self Care | Payer: Medicaid Other | Attending: Internal Medicine | Admitting: Internal Medicine

## 2022-07-24 ENCOUNTER — Emergency Department: Payer: Medicaid Other

## 2022-07-24 ENCOUNTER — Other Ambulatory Visit: Payer: Self-pay

## 2022-07-24 DIAGNOSIS — Z1152 Encounter for screening for COVID-19: Secondary | ICD-10-CM

## 2022-07-24 DIAGNOSIS — Z79899 Other long term (current) drug therapy: Secondary | ICD-10-CM

## 2022-07-24 DIAGNOSIS — F17210 Nicotine dependence, cigarettes, uncomplicated: Secondary | ICD-10-CM | POA: Diagnosis not present

## 2022-07-24 DIAGNOSIS — R7989 Other specified abnormal findings of blood chemistry: Secondary | ICD-10-CM | POA: Diagnosis not present

## 2022-07-24 DIAGNOSIS — E871 Hypo-osmolality and hyponatremia: Secondary | ICD-10-CM | POA: Diagnosis present

## 2022-07-24 DIAGNOSIS — J189 Pneumonia, unspecified organism: Principal | ICD-10-CM

## 2022-07-24 DIAGNOSIS — B349 Viral infection, unspecified: Secondary | ICD-10-CM

## 2022-07-24 DIAGNOSIS — F17290 Nicotine dependence, other tobacco product, uncomplicated: Secondary | ICD-10-CM | POA: Diagnosis not present

## 2022-07-24 DIAGNOSIS — R6889 Other general symptoms and signs: Secondary | ICD-10-CM

## 2022-07-24 DIAGNOSIS — E876 Hypokalemia: Secondary | ICD-10-CM | POA: Diagnosis not present

## 2022-07-24 DIAGNOSIS — Z833 Family history of diabetes mellitus: Secondary | ICD-10-CM

## 2022-07-24 LAB — HEPATIC FUNCTION PANEL
ALT: 56 U/L — ABNORMAL HIGH (ref 0–44)
AST: 49 U/L — ABNORMAL HIGH (ref 15–41)
Albumin: 3.7 g/dL (ref 3.5–5.0)
Alkaline Phosphatase: 169 U/L — ABNORMAL HIGH (ref 38–126)
Bilirubin, Direct: 0.5 mg/dL — ABNORMAL HIGH (ref 0.0–0.2)
Indirect Bilirubin: 0.7 mg/dL (ref 0.3–0.9)
Total Bilirubin: 1.2 mg/dL (ref 0.3–1.2)
Total Protein: 7.4 g/dL (ref 6.5–8.1)

## 2022-07-24 LAB — BASIC METABOLIC PANEL
Anion gap: 10 (ref 5–15)
BUN: 7 mg/dL (ref 6–20)
CO2: 23 mmol/L (ref 22–32)
Calcium: 7.9 mg/dL — ABNORMAL LOW (ref 8.9–10.3)
Chloride: 90 mmol/L — ABNORMAL LOW (ref 98–111)
Creatinine, Ser: 0.88 mg/dL (ref 0.61–1.24)
GFR, Estimated: >60 mL/min (ref >60)
Glucose, Bld: 113 mg/dL — ABNORMAL HIGH (ref 70–99)
Potassium: 3.3 mmol/L — ABNORMAL LOW (ref 3.5–5.1)
Sodium: 123 mmol/L — ABNORMAL LOW (ref 135–145)

## 2022-07-24 LAB — CBC
HCT: 37 % — ABNORMAL LOW (ref 39.0–52.0)
Hemoglobin: 13.7 g/dL (ref 13.0–17.0)
MCH: 31.9 pg (ref 26.0–34.0)
MCHC: 37 g/dL — ABNORMAL HIGH (ref 30.0–36.0)
MCV: 86.2 fL (ref 80.0–100.0)
Platelets: 195 10*3/uL (ref 150–400)
RBC: 4.29 MIL/uL (ref 4.22–5.81)
RDW: 12.4 % (ref 11.5–15.5)
WBC: 5.3 10*3/uL (ref 4.0–10.5)
nRBC: 0 % (ref 0.0–0.2)

## 2022-07-24 LAB — OSMOLALITY, URINE: Osmolality, Ur: 145 mOsm/kg — ABNORMAL LOW (ref 300–900)

## 2022-07-24 LAB — SARS CORONAVIRUS 2 BY RT PCR: SARS Coronavirus 2 by RT PCR: NEGATIVE

## 2022-07-24 LAB — HIV ANTIBODY (ROUTINE TESTING W REFLEX): HIV Screen 4th Generation wRfx: NONREACTIVE

## 2022-07-24 LAB — NA AND K (SODIUM & POTASSIUM), RAND UR
Potassium Urine: 12 mmol/L
Sodium, Ur: <10 mmol/L

## 2022-07-24 LAB — OSMOLALITY: Osmolality: 267 mOsm/kg — ABNORMAL LOW (ref 275–295)

## 2022-07-24 LAB — TSH: TSH: 1.523 u[IU]/mL (ref 0.350–4.500)

## 2022-07-24 MED ORDER — ENOXAPARIN SODIUM 40 MG/0.4ML IJ SOSY
40.0000 mg | PREFILLED_SYRINGE | INTRAMUSCULAR | Status: DC
  Administered 2022-07-24: 40 mg via SUBCUTANEOUS
  Filled 2022-07-24 (×2): qty 0.4

## 2022-07-24 MED ORDER — HYDROCOD POLI-CHLORPHE POLI ER 10-8 MG/5ML PO SUER
5.0000 mL | Freq: Two times a day (BID) | ORAL | Status: DC | PRN
  Administered 2022-07-24 – 2022-07-25 (×2): 5 mL via ORAL
  Filled 2022-07-24 (×2): qty 5

## 2022-07-24 MED ORDER — SODIUM CHLORIDE 0.9 % IV SOLN
2.0000 g | INTRAVENOUS | Status: DC
  Administered 2022-07-24 – 2022-07-25 (×2): 2 g via INTRAVENOUS
  Filled 2022-07-24 (×3): qty 20

## 2022-07-24 MED ORDER — TRAZODONE HCL 50 MG PO TABS
50.0000 mg | ORAL_TABLET | Freq: Every evening | ORAL | Status: DC | PRN
  Administered 2022-07-24 – 2022-07-25 (×2): 50 mg via ORAL
  Filled 2022-07-24 (×2): qty 1

## 2022-07-24 MED ORDER — GUAIFENESIN ER 600 MG PO TB12
600.0000 mg | ORAL_TABLET | Freq: Two times a day (BID) | ORAL | Status: DC
  Administered 2022-07-24 – 2022-07-26 (×5): 600 mg via ORAL
  Filled 2022-07-24 (×5): qty 1

## 2022-07-24 MED ORDER — ONDANSETRON HCL 4 MG/2ML IJ SOLN: 4.0000 mg | Freq: Four times a day (QID) | INTRAMUSCULAR | Status: DC | PRN

## 2022-07-24 MED ORDER — TRAZODONE HCL 50 MG PO TABS: 25.0000 mg | ORAL_TABLET | Freq: Every evening | ORAL | Status: DC | PRN

## 2022-07-24 MED ORDER — MAGNESIUM HYDROXIDE 400 MG/5ML PO SUSP: 30.0000 mL | Freq: Every day | ORAL | Status: DC | PRN

## 2022-07-24 MED ORDER — ONDANSETRON 4 MG PO TBDP: 4.0000 mg | ORAL_TABLET | Freq: Four times a day (QID) | ORAL | Status: DC | PRN

## 2022-07-24 MED ORDER — SODIUM CHLORIDE 0.9 % IV SOLN
500.0000 mg | INTRAVENOUS | Status: DC
  Administered 2022-07-24 – 2022-07-25 (×2): 500 mg via INTRAVENOUS
  Filled 2022-07-24 (×3): qty 5

## 2022-07-24 MED ORDER — SODIUM CHLORIDE 0.9 % IV SOLN: INTRAVENOUS | Status: DC

## 2022-07-24 MED ORDER — ALBUTEROL SULFATE (2.5 MG/3ML) 0.083% IN NEBU: 2.5000 mg | INHALATION_SOLUTION | Freq: Four times a day (QID) | RESPIRATORY_TRACT | Status: DC | PRN

## 2022-07-24 MED ORDER — ACETAMINOPHEN 325 MG PO TABS
650.0000 mg | ORAL_TABLET | Freq: Four times a day (QID) | ORAL | Status: DC | PRN
  Administered 2022-07-24 – 2022-07-25 (×3): 650 mg via ORAL
  Filled 2022-07-24 (×3): qty 2

## 2022-07-24 MED ORDER — ACETAMINOPHEN 650 MG RE SUPP: 650.0000 mg | Freq: Four times a day (QID) | RECTAL | Status: DC | PRN

## 2022-07-24 MED ORDER — SODIUM CHLORIDE 0.9 % IV BOLUS
1000.0000 mL | Freq: Once | INTRAVENOUS | Status: AC
  Administered 2022-07-24: 1000 mL via INTRAVENOUS

## 2022-07-24 MED ORDER — POTASSIUM CHLORIDE CRYS ER 20 MEQ PO TBCR
40.0000 meq | EXTENDED_RELEASE_TABLET | Freq: Once | ORAL | Status: AC
  Administered 2022-07-24: 40 meq via ORAL
  Filled 2022-07-24: qty 2

## 2022-07-24 MED ORDER — ONDANSETRON HCL 4 MG PO TABS: 4.0000 mg | ORAL_TABLET | Freq: Four times a day (QID) | ORAL | Status: DC | PRN

## 2022-07-24 MED ORDER — GUAIFENESIN ER 600 MG PO TB12: 600.0000 mg | ORAL_TABLET | Freq: Two times a day (BID) | ORAL | Status: DC | PRN

## 2022-07-24 MED ORDER — HYDROCODONE-HOMATROPINE 5-1.5 MG/5ML PO SYRP: 5.0000 mL | ORAL_SOLUTION | Freq: Two times a day (BID) | ORAL | Status: DC | PRN

## 2022-07-24 NOTE — ED Notes (Signed)
See triage note  Presents with fever  Was seen 2 days ago  Dx with pneumonia   States he just is not able to get the fever to go away  States he uses OTC meds  Presents with low grade temp  States he feels like is not able to rest d/t the fever and sweating at night

## 2022-07-24 NOTE — Assessment & Plan Note (Signed)
-   The patient will be hydrated with IV normal saline. - Will obtain hyponatremia workup. - We will limit p.o. water intake. - We will follow serial BMPs.

## 2022-07-24 NOTE — H&P (Signed)
[place]   PATIENT NAME: Logan Clark    MR#:  [id number]  DATE OF BIRTH:  June 07, 1980  DATE OF ADMISSION:  07/24/2022  PRIMARY CARE PHYSICIAN: Pcp, No   Patient is coming from: Home  REQUESTING/REFERRING PHYSICIAN: Pilar Jarvis, MD CHIEF COMPLAINT:   Chief Complaint  Patient presents with   Fever    HISTORY OF PRESENT ILLNESS:  DRESHON PROFFIT is a 42 y.o. Caucasian male with no chronic medical history, who presented to the emergency room with acute onset of fever of 104 with chills as well as cough productive of clear sputum which started a few days ago.  The patient was seen in the ER and was given IV fluids and managed with p.o. Augmentin that he did not feel better.  He admitted to fatigue and tiredness.  Denied any loss of taste or smell.  He tested negative for COVID-19 a couple days ago.  No chest pain or palpitations.  He admitted to mild left-sided abdominal pain.  No dysuria, oliguria or hematuria or flank pain.  No rhinorrhea or nasal congestion no sore throat or earache.  ED Course: Upon presentation to the emergency room, BP was 148/86 with temperature 99.8 and otherwise normal vital signs.  Labs revealed hyponatremia 123 and hypochloremia of 90, hypokalemia 3.3, alk phos of 169, AST 49 ALT 56.  CBC was within normal.  COVID-19 PCR came back negative today.  EKG as reviewed by me : None Imaging: 2 view chest x-ray showed no change to minimally worsened right mid and lower lung pneumonia.  The patient was given 40 mill equivalent potassium chloride and 1 L bolus of IV normal saline.  He will be admitted to a medical telemetry bed for further evaluation and management. PAST MEDICAL HISTORY:   Past Medical History:  Diagnosis Date   Allergy     PAST SURGICAL HISTORY:  History reviewed. No pertinent surgical history.  No previous surgeries.  SOCIAL HISTORY:   Social History   Tobacco Use   Smoking status: Every Day    Types: Cigarettes   Smokeless  tobacco: Never  Substance Use Topics   Alcohol use: Yes    FAMILY HISTORY:   Family History  Problem Relation Age of Onset   Diabetes Mother    Cancer Father     DRUG ALLERGIES:  No Known Allergies  REVIEW OF SYSTEMS:   ROS As per history of present illness. All pertinent systems were reviewed above. Constitutional, HEENT, cardiovascular, respiratory, GI, GU, musculoskeletal, neuro, psychiatric, endocrine, integumentary and hematologic systems were reviewed and are otherwise negative/unremarkable except for positive findings mentioned above in the HPI.   MEDICATIONS AT HOME:   Prior to Admission medications   Medication Sig Start Date End Date Taking? Authorizing Provider  albuterol (PROVENTIL HFA;VENTOLIN HFA) 108 (90 Base) MCG/ACT inhaler Inhale 1-2 puffs into the lungs every 6 (six) hours as needed. 01/28/18   Nche, Bonna Gains, NP  amoxicillin (AMOXIL) 500 MG capsule Take 2 capsules (1,000 mg total) by mouth 3 (three) times daily for 5 days. 07/22/22 07/27/22  Sharyn Creamer, MD  doxycycline (VIBRAMYCIN) 100 MG capsule Take 1 capsule (100 mg total) by mouth 2 (two) times daily for 7 days. 07/22/22 07/29/22  Sharyn Creamer, MD  guaiFENesin (MUCINEX) 600 MG 12 hr tablet Take 1 tablet (600 mg total) by mouth 2 (two) times daily as needed for cough or to loosen phlegm. 01/28/18   Nche, Bonna Gains, NP  HYDROcodone-homatropine (HYCODAN) 5-1.5  MG/5ML syrup Take 5 mLs by mouth every 12 (twelve) hours as needed. 01/28/18   Nche, Bonna Gains, NP  ondansetron (ZOFRAN-ODT) 4 MG disintegrating tablet Take 1 tablet (4 mg total) by mouth every 6 (six) hours as needed for nausea or vomiting. 07/22/22   Sharyn Creamer, MD  oseltamivir (TAMIFLU) 75 MG capsule Take 1 capsule (75 mg total) by mouth 2 (two) times daily. 01/28/18   Nche, Bonna Gains, NP  traZODone (DESYREL) 50 MG tablet Take 1 tablet (50 mg total) by mouth at bedtime as needed for sleep. 12/11/17   Cirigliano, Jearld Lesch, DO      VITAL SIGNS:   Blood pressure (!) 140/88, pulse 80, temperature 99 F (37.2 C), temperature source Oral, resp. rate 18, height 5\' 10"  (1.778 m), weight 90 kg, SpO2 96%.  PHYSICAL EXAMINATION:  Physical Exam  GENERAL:  42 y.o.-year-old Caucasian male patient lying in the bed with no acute distress.  EYES: Pupils equal, round, reactive to light and accommodation. No scleral icterus. Extraocular muscles intact.  HEENT: Head atraumatic, normocephalic. Oropharynx and nasopharynx clear.  NECK:  Supple, no jugular venous distention. No thyroid enlargement, no tenderness.  LUNGS: Diminished right basilar midlung zone breath sounds with associated crackles.  No use of accessory muscles of respiration.  CARDIOVASCULAR: Regular rate and rhythm, S1, S2 normal. No murmurs, rubs, or gallops.  ABDOMEN: Soft, nondistended, nontender. Bowel sounds present. No organomegaly or mass.  EXTREMITIES: No pedal edema, cyanosis, or clubbing.  NEUROLOGIC: Cranial nerves II through XII are intact. Muscle strength 5/5 in all extremities. Sensation intact. Gait not checked.  PSYCHIATRIC: The patient is alert and oriented x 3.  Normal affect and good eye contact. SKIN: No obvious rash, lesion, or ulcer.   LABORATORY PANEL:   CBC Recent Labs  Lab 07/24/22 1230  WBC 5.3  HGB 13.7  HCT 37.0*  PLT 195   ------------------------------------------------------------------------------------------------------------------  Chemistries  Recent Labs  Lab 07/24/22 1230  NA 123*  K 3.3*  CL 90*  CO2 23  GLUCOSE 113*  BUN 7  CREATININE 0.88  CALCIUM 7.9*  AST 49*  ALT 56*  ALKPHOS 169*  BILITOT 1.2   ------------------------------------------------------------------------------------------------------------------  Cardiac Enzymes No results for input(s): "TROPONINI" in the last 168 hours. ------------------------------------------------------------------------------------------------------------------  RADIOLOGY:  DG  Chest 2 View  Result Date: 07/24/2022 CLINICAL DATA:  Fever for 5 days. Diagnosed with pneumonia 07/22/2022. EXAM: CHEST - 2 VIEW COMPARISON:  PA chest and right rib radiographs 09/12/2013, chest two views 07/22/2022 FINDINGS: Cardiac silhouette and mediastinal contours are within normal limits. Unchanged to minimally worsened right mid and lower lung heterogeneous airspace opacification, appearing to extend slightly more superior compared to radiograph 2 days prior. Left lung is clear. No pleural effusion pneumothorax. No acute skeletal abnormality. IMPRESSION: Unchanged to minimally worsened right mid and lower lung pneumonia. Electronically Signed   By: Neita Garnet M.D.   On: 07/24/2022 13:34      IMPRESSION AND PLAN:  Assessment and Plan: * Community acquired pneumonia of right lung - The patient will be admitted to a medical telemetry bed. - Will continue antibiotic therapy with IV Rocephin and Zithromax. - Mucolytic therapy be provided as well as duo nebs q.i.d. and q.4 hours p.r.n. - We will follow blood cultures.   Hyponatremia - The patient will be hydrated with IV normal saline. - Will obtain hyponatremia workup. - We will limit p.o. water intake. - We will follow serial BMPs.  Hypokalemia - We will replace potassium and check  magnesium level.  Elevated LFTs - We will follow LFTs with hydration.   DVT prophylaxis: Lovenox.  Advanced Care Planning:  Code Status: full code.  Family Communication:  The plan of care was discussed in details with the patient (and family). I answered all questions. The patient agreed to proceed with the above mentioned plan. Further management will depend upon hospital course. Disposition Plan: Back to previous home environment Consults called: none.  All the records are reviewed and case discussed with ED provider.  Status is: Inpatient  At the time of the admission, it appears that the appropriate admission status for this patient is  inpatient.  This is judged to be reasonable and necessary in order to provide the required intensity of service to ensure the patient's safety given the presenting symptoms, physical exam findings and initial radiographic and laboratory data in the context of comorbid conditions.  The patient requires inpatient status due to high intensity of service, high risk of further deterioration and high frequency of surveillance required.  I certify that at the time of admission, it is my clinical judgment that the patient will require inpatient hospital care extending more than 2 midnights.                            Dispo: The patient is from: Home              Anticipated d/c is to: Home              Patient currently is not medically stable to d/c.              Difficult to place patient: No  Hannah Beat M.D on 07/24/2022 at 3:39 PM  [facility]   From 7 PM-7 AM, contact night-coverage [email or web address]  CC: Primary care physician; Pcp, No

## 2022-07-24 NOTE — Plan of Care (Signed)

## 2022-07-24 NOTE — ED Triage Notes (Signed)
Pt to ED for continued fever x5 days. Seen on 7/21 and dx PNA. Fever relief with tylenol and advil. Taking amoxicillin

## 2022-07-24 NOTE — Assessment & Plan Note (Signed)
-  We will replace potassium and check magnesium level. 

## 2022-07-24 NOTE — Assessment & Plan Note (Signed)
-   The patient will be admitted to a medical telemetry bed. - Will continue antibiotic therapy with IV Rocephin and Zithromax. - Mucolytic therapy be provided as well as duo nebs q.i.d. and q.4 hours p.r.n. - We will follow blood cultures.

## 2022-07-24 NOTE — Plan of Care (Signed)
?  Problem: Clinical Measurements: ?Goal: Ability to maintain a body temperature in the normal range will improve ?Outcome: Progressing ?  ?Problem: Respiratory: ?Goal: Ability to maintain adequate ventilation will improve ?Outcome: Progressing ?Goal: Ability to maintain a clear airway will improve ?Outcome: Progressing ?  ?Problem: Activity: ?Goal: Ability to tolerate increased activity will improve ?Outcome: Progressing ?  ?

## 2022-07-24 NOTE — Assessment & Plan Note (Signed)
-  We will follow LFTs with hydration.

## 2022-07-24 NOTE — ED Provider Notes (Signed)
[facility] Provider Note    Event Date/Time   First MD Initiated Contact with Patient 07/24/22 1402     (approximate)   History   Fever   HPI  Logan Clark is a 42 y.o. male   Past medical history of no significant past medical history who presents the emergency department with profound fatigue, poor p.o. intake, nausea, ongoing cough and fever since being diagnosed with pneumonia 2 days ago.  Has been compliant with amoxicillin and doxycycline.  Has been taking Tylenol and Advil continues to feel unwell, fevers.  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  Has diffuse myalgias and joint pain as well.  He has hyponatremia now at 123 from 129    External Medical Documents Reviewed: Emergency department notes dated 07/22/2022 when he was diagnosed with pneumonia      Physical Exam   Triage Vital Signs: ED Triage Vitals  Encounter Vitals Group     BP 07/24/22 1232 (!) 148/86     Systolic BP Percentile --      Diastolic BP Percentile --      Pulse Rate 07/24/22 1232 87     Resp 07/24/22 1232 20     Temp 07/24/22 1232 99.8 F (37.7 C)     Temp src --      SpO2 07/24/22 1232 96 %     Weight 07/24/22 1234 198 lb 6.6 oz (90 kg)     Height 07/24/22 1234 5\' 10"  (1.778 m)     Head Circumference --      Peak Flow --      Pain Score 07/24/22 1234 0     Pain Loc --      Pain Education --      Exclude from Growth Chart --     Most recent vital signs: Vitals:   07/24/22 1232  BP: (!) 148/86  Pulse: 87  Resp: 20  Temp: 99.8 F (37.7 C)  SpO2: 96%    General: Awake, no distress.  CV:  Good peripheral perfusion.  Resp:  Normal effort.  Abd:  No distention.  Other:  Nontoxic appearance pleasant gentleman.  Hypertensive otherwise vital signs normal.  Temperature 99.8.  Lung sounds without wheezing or focality.  Soft nontender abdomen.  Dry mucous membranes looks slightly dehydrated.   ED Results / Procedures / Treatments    Labs (all labs ordered are listed, but only abnormal results are displayed) Labs Reviewed  CBC - Abnormal; Notable for the following components:      Result Value   HCT 37.0 (*)    MCHC 37.0 (*)    All other components within normal limits  BASIC METABOLIC PANEL - Abnormal; Notable for the following components:   Sodium 123 (*)    Potassium 3.3 (*)    Chloride 90 (*)    Glucose, Bld 113 (*)    Calcium 7.9 (*)    All other components within normal limits  LEGIONELLA PNEUMOPHILA SEROGP 1 UR AG  HEPATIC FUNCTION PANEL     I ordered and reviewed the above labs they are notable for hyponatremia 123    RADIOLOGY I independently reviewed and interpreted chest x-ray and see a right-sided airway opacity concerning for pneumonia I also reviewed radiologist's formal read.   PROCEDURES:  Critical Care performed: No  Procedures   MEDICATIONS ORDERED IN ED: Medications  sodium chloride 0.9 % bolus 1,000 mL (has no administration in time range)  potassium  chloride SA (KLOR-CON M) CR tablet 40 mEq (has no administration in time range)    External physician / consultants:  I spoke with hospitalist for admit and  regarding care plan for this patient.   IMPRESSION / MDM / ASSESSMENT AND PLAN / ED COURSE  I reviewed the triage vital signs and the nursing notes.                                Patient's presentation is most consistent with acute presentation with potential threat to life or bodily function.  Differential diagnosis includes, but is not limited to, pneumonia, hyponatremia, electrolyte derangement, AKI, dehydration   The patient is on the cardiac monitor to evaluate for evidence of arrhythmia and/or significant heart rate changes.  MDM: Patient with community-acquired pneumonia, worsening despite antibiotic treatment, with worsening hyponatremia now down to 123.  No neurologic symptoms or seizures.  Has had very poor p.o. intake in the setting of nausea with  commune acquired pneumonia, myalgias, so will hydrate with 1 saline bolus 1 L and admit, for careful controlled correction of hyponatremia, added on Legionella urine antigen       FINAL CLINICAL IMPRESSION(S) / ED DIAGNOSES   Final diagnoses:  Community acquired pneumonia of left lung, unspecified part of lung  Hyponatremia  Community acquired pneumonia of right lower lobe of lung     Rx / DC Orders   ED Discharge Orders     None        Note:  This document was prepared using Dragon voice recognition software and may include unintentional dictation errors.    Pilar Jarvis, MD 07/24/22 [phone]

## 2022-07-25 DIAGNOSIS — E876 Hypokalemia: Secondary | ICD-10-CM | POA: Diagnosis not present

## 2022-07-25 DIAGNOSIS — E871 Hypo-osmolality and hyponatremia: Secondary | ICD-10-CM | POA: Diagnosis not present

## 2022-07-25 DIAGNOSIS — R7989 Other specified abnormal findings of blood chemistry: Secondary | ICD-10-CM | POA: Diagnosis not present

## 2022-07-25 DIAGNOSIS — F17290 Nicotine dependence, other tobacco product, uncomplicated: Secondary | ICD-10-CM

## 2022-07-25 DIAGNOSIS — J189 Pneumonia, unspecified organism: Secondary | ICD-10-CM | POA: Diagnosis not present

## 2022-07-25 LAB — CBC
HCT: 34.3 % — ABNORMAL LOW (ref 39.0–52.0)
Hemoglobin: 12.7 g/dL — ABNORMAL LOW (ref 13.0–17.0)
MCH: 32 pg (ref 26.0–34.0)
MCHC: 37 g/dL — ABNORMAL HIGH (ref 30.0–36.0)
MCV: 86.4 fL (ref 80.0–100.0)
Platelets: 168 10*3/uL (ref 150–400)
RBC: 3.97 MIL/uL — ABNORMAL LOW (ref 4.22–5.81)
RDW: 12.7 % (ref 11.5–15.5)
WBC: 4.4 10*3/uL (ref 4.0–10.5)
nRBC: 0 % (ref 0.0–0.2)

## 2022-07-25 LAB — BASIC METABOLIC PANEL
Anion gap: 6 (ref 5–15)
BUN: 6 mg/dL (ref 6–20)
CO2: 27 mmol/L (ref 22–32)
Calcium: 8 mg/dL — ABNORMAL LOW (ref 8.9–10.3)
Chloride: 99 mmol/L (ref 98–111)
Creatinine, Ser: 0.97 mg/dL (ref 0.61–1.24)
GFR, Estimated: >60 mL/min (ref >60)
Glucose, Bld: 114 mg/dL — ABNORMAL HIGH (ref 70–99)
Potassium: 3.9 mmol/L (ref 3.5–5.1)
Sodium: 132 mmol/L — ABNORMAL LOW (ref 135–145)

## 2022-07-25 LAB — HEPATIC FUNCTION PANEL
ALT: 44 U/L (ref 0–44)
AST: 35 U/L (ref 15–41)
Albumin: 2.9 g/dL — ABNORMAL LOW (ref 3.5–5.0)
Alkaline Phosphatase: 134 U/L — ABNORMAL HIGH (ref 38–126)
Bilirubin, Direct: 0.2 mg/dL (ref 0.0–0.2)
Indirect Bilirubin: 0.3 mg/dL (ref 0.3–0.9)
Total Bilirubin: 0.5 mg/dL (ref 0.3–1.2)
Total Protein: 6.3 g/dL — ABNORMAL LOW (ref 6.5–8.1)

## 2022-07-25 MED ORDER — HYDROCOD POLI-CHLORPHE POLI ER 10-8 MG/5ML PO SUER
5.0000 mL | Freq: Two times a day (BID) | ORAL | Status: DC
  Administered 2022-07-25 – 2022-07-26 (×2): 5 mL via ORAL
  Filled 2022-07-25 (×2): qty 5

## 2022-07-25 MED ORDER — GUAIFENESIN 100 MG/5ML PO LIQD
5.0000 mL | ORAL | Status: DC | PRN
  Administered 2022-07-25: 5 mL via ORAL
  Filled 2022-07-25: qty 10

## 2022-07-25 NOTE — Plan of Care (Signed)
  Problem: Activity: Goal: Ability to tolerate increased activity will improve Outcome: Progressing   Problem: Clinical Measurements: Goal: Ability to maintain a body temperature in the normal range will improve Outcome: Progressing   Problem: Respiratory: Goal: Ability to maintain adequate ventilation will improve Outcome: Progressing Goal: Ability to maintain a clear airway will improve Outcome: Progressing   Problem: Education: Goal: Knowledge of General Education information will improve Description: Including pain rating scale, medication(s)/side effects and non-pharmacologic comfort measures Outcome: Progressing   Problem: Health Behavior/Discharge Planning: Goal: Ability to manage health-related needs will improve Outcome: Progressing   Problem: Clinical Measurements: Goal: Ability to maintain clinical measurements within normal limits will improve Outcome: Progressing Goal: Will remain free from infection Outcome: Progressing Goal: Diagnostic test results will improve Outcome: Progressing Goal: Respiratory complications will improve Outcome: Progressing Goal: Cardiovascular complication will be avoided Outcome: Progressing   Problem: Activity: Goal: Risk for activity intolerance will decrease Outcome: Progressing   Problem: Nutrition: Goal: Adequate nutrition will be maintained Outcome: Progressing   Problem: Coping: Goal: Level of anxiety will decrease Outcome: Progressing   Problem: Elimination: Goal: Will not experience complications related to bowel motility Outcome: Progressing Goal: Will not experience complications related to urinary retention Outcome: Progressing   Problem: Pain Managment: Goal: General experience of comfort will improve Outcome: Progressing   Problem: Safety: Goal: Ability to remain free from injury will improve Outcome: Progressing   Problem: Skin Integrity: Goal: Risk for impaired skin integrity will decrease Outcome:  Progressing   Problem: Activity: Goal: Ability to tolerate increased activity will improve Outcome: Progressing   Problem: Clinical Measurements: Goal: Ability to maintain a body temperature in the normal range will improve Outcome: Progressing   Problem: Respiratory: Goal: Ability to maintain adequate ventilation will improve Outcome: Progressing Goal: Ability to maintain a clear airway will improve Outcome: Progressing

## 2022-07-25 NOTE — Progress Notes (Signed)
  Progress Note   Patient: Logan Clark PPI:[id number] DOB: 1980/01/23 DOA: 07/24/2022     1 DOS: the patient was seen and examined on 07/25/2022   Brief hospital course: Logan Clark is a 42 y.o. Caucasian male with no chronic medical history, who presented to the emergency room with acute onset of fever of 104 with chills as well as cough productive of clear sputum which started a few days ago.  The patient was seen in the ER and was given IV fluids and managed with p.o. Augmentin that he did not feel better.  He admitted to fatigue and tiredness.  Patient is noted to have low-grade temperature, hyponatremia with sodium 123, hypokalemia, elevated liver enzymes.  Patient is admitted to the hospitalist service for further management evaluation of community-acquired pneumonia, electrolyte imbalances.  Assessment and Plan: * Community acquired pneumonia of right lung Continue antibiotic therapy with IV Rocephin and Zithromax. Mucolytic therapy be provided as well as duo nebs q.i.d. and q.4 hours p.r.n. Continue Robitussin every 4 hours as needed. Pulmonary toilet. Encourage incentive spirometry.  Hyponatremia Sodium improved with gentle IV hydration Continue to trend sodium.  Hypokalemia Potassium 3.9 status post replacement.  Elevated LFTs Repeat LFTs ordered. Avoid hepatotoxic drugs.  Possible discharge tomorrow if he remains stable.      Subjective: Patient is seen and examined today morning.  He is complaining of severe cough, did not sleep last night.  Patient is on as needed antitussives not helping.  Patient is able to get out of bed, walk around.  Eating fair.  Physical Exam: Vitals:   07/24/22 1620 07/24/22 2254 07/25/22 0733 07/25/22 1517  BP: 132/80 109/65 119/64 (!) 107/53  Pulse: 82 76 64 63  Resp: 18 18 17 17   Temp: (!) 100.7 F (38.2 C) 99.5 F (37.5 C) 99 F (37.2 C) 98.3 F (36.8 C)  TempSrc:      SpO2: 97% 95% 98% 95%  Weight:      Height:        General -young Caucasian male, no apparent distress HEENT - PERRLA, EOMI, atraumatic head, non tender sinuses. Lung - Clear, diffuse rhonchi, bibasilar rales Heart - S1, S2 heard, no murmurs, rubs, trace pedal edema Neuro - Alert, awake and oriented x 3, non focal exam. Skin - Warm and dry. Data Reviewed:  CBC, BMP, chest x-ray  Family Communication: Patient understands and agrees with the current plan  Disposition: Status is: Inpatient Remains inpatient appropriate because: IV antibiotics, electrolyte correction  Planned Discharge Destination: Home    Time spent: 43 minutes  Author: Marcelino Duster, MD 07/25/2022 5:03 PM  For on call review [email or web address].

## 2022-07-26 ENCOUNTER — Other Ambulatory Visit: Payer: Self-pay

## 2022-07-26 DIAGNOSIS — E871 Hypo-osmolality and hyponatremia: Secondary | ICD-10-CM | POA: Diagnosis not present

## 2022-07-26 DIAGNOSIS — E876 Hypokalemia: Secondary | ICD-10-CM | POA: Diagnosis not present

## 2022-07-26 DIAGNOSIS — J189 Pneumonia, unspecified organism: Secondary | ICD-10-CM | POA: Diagnosis not present

## 2022-07-26 DIAGNOSIS — F17290 Nicotine dependence, other tobacco product, uncomplicated: Secondary | ICD-10-CM | POA: Diagnosis not present

## 2022-07-26 DIAGNOSIS — R7989 Other specified abnormal findings of blood chemistry: Secondary | ICD-10-CM | POA: Diagnosis not present

## 2022-07-26 LAB — CBC
HCT: 35.1 % — ABNORMAL LOW (ref 39.0–52.0)
MCHC: 36.2 g/dL — ABNORMAL HIGH (ref 30.0–36.0)
MCV: 88.9 fL (ref 80.0–100.0)
Platelets: 220 10*3/uL (ref 150–400)
RBC: 3.95 MIL/uL — ABNORMAL LOW (ref 4.22–5.81)
WBC: 4.7 10*3/uL (ref 4.0–10.5)
nRBC: 0 % (ref 0.0–0.2)

## 2022-07-26 LAB — LEGIONELLA PNEUMOPHILA SEROGP 1 UR AG: L. pneumophila Serogp 1 Ur Ag: NEGATIVE

## 2022-07-26 LAB — COMPREHENSIVE METABOLIC PANEL
ALT: 41 U/L (ref 0–44)
AST: 28 U/L (ref 15–41)
Albumin: 2.9 g/dL — ABNORMAL LOW (ref 3.5–5.0)
Alkaline Phosphatase: 141 U/L — ABNORMAL HIGH (ref 38–126)
Anion gap: 10 (ref 5–15)
CO2: 26 mmol/L (ref 22–32)
Calcium: 8.1 mg/dL — ABNORMAL LOW (ref 8.9–10.3)
Chloride: 101 mmol/L (ref 98–111)
Creatinine, Ser: 0.78 mg/dL (ref 0.61–1.24)
Glucose, Bld: 109 mg/dL — ABNORMAL HIGH (ref 70–99)
Total Protein: 6.6 g/dL (ref 6.5–8.1)

## 2022-07-26 MED ORDER — AMOXICILLIN-POT CLAVULANATE 500-125 MG PO TABS
1.0000 | ORAL_TABLET | Freq: Three times a day (TID) | ORAL | 0 refills | Status: AC
  Filled 2022-07-26: qty 15, 5d supply, fill #0

## 2022-07-26 MED ORDER — ALBUTEROL SULFATE HFA 108 (90 BASE) MCG/ACT IN AERS
1.0000 | INHALATION_SPRAY | Freq: Four times a day (QID) | RESPIRATORY_TRACT | 0 refills | Status: AC | PRN
Start: 2022-07-26 — End: ?
  Filled 2022-07-26: qty 6.7, 30d supply, fill #0

## 2022-07-26 MED ORDER — GUAIFENESIN ER 600 MG PO TB12
600.0000 mg | ORAL_TABLET | Freq: Two times a day (BID) | ORAL | 0 refills | Status: AC
  Filled 2022-07-26: qty 20, 10d supply, fill #0

## 2022-07-26 NOTE — Discharge Summary (Signed)
Physician Discharge Summary   Patient: Logan Clark MRN: [id number] DOB: [birth year]  Admit date:     07/24/2022  Discharge date: 07/26/22  Discharge Physician: Marcelino Duster   PCP: Pcp, No   Recommendations at discharge:    PCP follow up in 1week  Discharge Diagnoses: Principal Problem:   Community acquired pneumonia of right lung Active Problems:   Hyponatremia   Hypokalemia   Elevated LFTs  Resolved Problems:   * No resolved hospital problems. *  Hospital Course: Logan Clark is a 42 y.o. Caucasian male with no chronic medical history, who presented to the emergency room with acute onset of fever of 104 with chills as well as cough productive of clear sputum which started a few days ago.  The patient was seen in the ER and was given IV fluids and managed with p.o. Augmentin that he did not feel better.  He admitted to fatigue and tiredness.  Patient is noted to have low-grade temperature, hyponatremia with sodium 123, hypokalemia, elevated liver enzymes.  Patient is admitted to the hospitalist service for further management evaluation of community-acquired pneumonia, electrolyte imbalances.   Assessment and Plan: * Community acquired pneumonia of right lung He got 3 days of IV Rocephin and Zithromax. Patient to go home on Augmentin for 5 days. Mucolytic therapy PRN Encourage incentive spirometry.   Hyponatremia Sodium improved with gentle IV hydration Encourage oral fluids.   Hypokalemia Potassium 3.9 status post replacement.   Elevated LFTs LFT improved. Avoid hepatotoxic drugs.       Consultants: none Procedures performed: none  Disposition: Home Diet recommendation:  Discharge Diet Orders (From admission, onward)     Start     Ordered   07/26/22 0000  Diet - low sodium heart healthy        07/26/22 1144           Cardiac diet DISCHARGE MEDICATION: Allergies as of 07/26/2022   No Known Allergies      Medication List     STOP  taking these medications    ondansetron 4 MG disintegrating tablet Commonly known as: ZOFRAN-ODT       TAKE these medications    albuterol 108 (90 Base) MCG/ACT inhaler Commonly known as: VENTOLIN HFA Inhale 1-2 puffs into the lungs every 6 (six) hours as needed.   amoxicillin-clavulanate 500-125 MG tablet Commonly known as: Augmentin Take 1 tablet by mouth 3 (three) times daily.   guaiFENesin 600 MG 12 hr tablet Commonly known as: MUCINEX Take 1 tablet (600 mg total) by mouth 2 (two) times daily. What changed:  when to take this reasons to take this        Discharge Exam: Filed Weights   07/24/22 1234  Weight: 90 kg    General -young Caucasian male, no apparent distress HEENT - PERRLA, EOMI, atraumatic head, non tender sinuses. Lung - Clear, diffuse rhonchi, bibasilar rales Heart - S1, S2 heard, no murmurs, rubs, trace pedal edema Neuro - Alert, awake and oriented x 3, non focal exam. Skin - Warm and dry.  Condition at discharge: stable  The results of significant diagnostics from this hospitalization (including imaging, microbiology, ancillary and laboratory) are listed below for reference.   Imaging Studies: DG Chest 2 View  Result Date: 07/24/2022 CLINICAL DATA:  Fever for 5 days. Diagnosed with pneumonia 07/22/2022. EXAM: CHEST - 2 VIEW COMPARISON:  PA chest and right rib radiographs 09/12/2013, chest two views 07/22/2022 FINDINGS: Cardiac silhouette and mediastinal contours are within normal limits.  Unchanged to minimally worsened right mid and lower lung heterogeneous airspace opacification, appearing to extend slightly more superior compared to radiograph 2 days prior. Left lung is clear. No pleural effusion pneumothorax. No acute skeletal abnormality. IMPRESSION: Unchanged to minimally worsened right mid and lower lung pneumonia. Electronically Signed   By: Neita Garnet M.D.   On: 07/24/2022 13:34   DG Chest 2 View  Result Date: 07/22/2022 CLINICAL DATA:   fever, mild rhonchi noted on R side EXAM: CHEST - 2 VIEW COMPARISON:  09/12/2013 FINDINGS: patchy right lower lobe airspace infiltrate, new since previous. Left lung clear. Heart size and mediastinal contours are within normal limits. No effusion. Visualized bones unremarkable. IMPRESSION: Right lower lobe infiltrate. Electronically Signed   By: Corlis Leak M.D.   On: 07/22/2022 13:17    Microbiology: Results for orders placed or performed during the hospital encounter of 07/24/22  SARS Coronavirus 2 by RT PCR (hospital order, performed in [facility] hospital lab) *cepheid single result test* Anterior Nasal Swab     Status: None   Collection Time: 07/24/22  2:44 PM   Specimen: Anterior Nasal Swab  Result Value Ref Range Status   SARS Coronavirus 2 by RT PCR NEGATIVE NEGATIVE Final    Comment: (NOTE) SARS-CoV-2 target nucleic acids are NOT DETECTED.  The SARS-CoV-2 RNA is generally detectable in upper and lower respiratory specimens during the acute phase of infection. The lowest concentration of SARS-CoV-2 viral copies this assay can detect is 250 copies / mL. A negative result does not preclude SARS-CoV-2 infection and should not be used as the sole basis for treatment or other patient management decisions.  A negative result may occur with improper specimen collection / handling, submission of specimen other than nasopharyngeal swab, presence of viral mutation(s) within the areas targeted by this assay, and inadequate number of viral copies (<250 copies / mL). A negative result must be combined with clinical observations, patient history, and epidemiological information.  Fact Sheet for Patients:   RoadLapTop.co.za  Fact Sheet for Healthcare Providers: http://kim-miller.com/  This test is not yet approved or  cleared by the [place] FDA and has been authorized for detection and/or diagnosis of SARS-CoV-2 by FDA under an Emergency Use  Authorization (EUA).  This EUA will remain in effect (meaning this test can be used) for the duration of the COVID-19 declaration under Section 564(b)(1) of the Act, 21 U.S.C. section 360bbb-3(b)(1), unless the authorization is terminated or revoked sooner.  Performed at [facility], [address] [place], [place] [address]     Labs: CBC: Recent Labs  Lab 07/22/22 1153 07/24/22 1230 07/25/22 0711 07/26/22 0314  WBC 6.5 5.3 4.4 4.7  HGB 15.6 13.7 12.7* 12.7*  HCT 41.7 37.0* 34.3* 35.1*  MCV 85.1 86.2 86.4 88.9  PLT 202 195 168 220   Basic Metabolic Panel: Recent Labs  Lab 07/22/22 1153 07/24/22 1230 07/25/22 0711 07/26/22 0314  NA 129* 123* 132* 137  K 3.6 3.3* 3.9 4.1  CL 94* 90* 99 101  CO2 21* 23 27 26   GLUCOSE 123* 113* 114* 109*  BUN 11 7 6 6   CREATININE 1.23 0.88 0.97 0.78  CALCIUM 8.6* 7.9* 8.0* 8.1*   Liver Function Tests: Recent Labs  Lab 07/22/22 1153 07/24/22 1230 07/25/22 0710 07/26/22 0314  AST 63* 49* 35 28  ALT 63* 56* 44 41  ALKPHOS 130* 169* 134* 141*  BILITOT 1.6* 1.2 0.5 0.5  PROT 7.8 7.4 6.3* 6.6  ALBUMIN 4.1 3.7  2.9* 2.9*   CBG: No results for input(s): "GLUCAP" in the last 168 hours.  Discharge time spent: greater than 30 minutes.  Signed: Marcelino Duster, MD [facility] 07/26/2022

## 2022-07-26 NOTE — Plan of Care (Signed)
  Problem: Activity: Goal: Ability to tolerate increased activity will improve Outcome: Progressing   Problem: Clinical Measurements: Goal: Ability to maintain a body temperature in the normal range will improve Outcome: Progressing   Problem: Respiratory: Goal: Ability to maintain adequate ventilation will improve Outcome: Progressing   

## 2022-07-26 NOTE — Progress Notes (Signed)
IV removed. AVS discussed and provided to patient. Patient waiting on meds from the pharmacy.

## 2022-07-26 NOTE — Plan of Care (Signed)
  Problem: Activity: Goal: Ability to tolerate increased activity will improve Outcome: Progressing   Problem: Clinical Measurements: Goal: Ability to maintain a body temperature in the normal range will improve Outcome: Progressing   Problem: Respiratory: Goal: Ability to maintain adequate ventilation will improve Outcome: Progressing Goal: Ability to maintain a clear airway will improve Outcome: Progressing   Problem: Education: Goal: Knowledge of General Education information will improve Description: Including pain rating scale, medication(s)/side effects and non-pharmacologic comfort measures Outcome: Progressing   Problem: Health Behavior/Discharge Planning: Goal: Ability to manage health-related needs will improve Outcome: Progressing   Problem: Clinical Measurements: Goal: Ability to maintain clinical measurements within normal limits will improve Outcome: Progressing Goal: Will remain free from infection Outcome: Progressing Goal: Diagnostic test results will improve Outcome: Progressing Goal: Respiratory complications will improve Outcome: Progressing Goal: Cardiovascular complication will be avoided Outcome: Progressing   Problem: Activity: Goal: Risk for activity intolerance will decrease Outcome: Progressing   Problem: Nutrition: Goal: Adequate nutrition will be maintained Outcome: Progressing   Problem: Coping: Goal: Level of anxiety will decrease Outcome: Progressing   Problem: Elimination: Goal: Will not experience complications related to bowel motility Outcome: Progressing Goal: Will not experience complications related to urinary retention Outcome: Progressing   Problem: Pain Managment: Goal: General experience of comfort will improve Outcome: Progressing   Problem: Safety: Goal: Ability to remain free from injury will improve Outcome: Progressing   Problem: Skin Integrity: Goal: Risk for impaired skin integrity will decrease Outcome:  Progressing   Problem: Activity: Goal: Ability to tolerate increased activity will improve Outcome: Progressing   Problem: Clinical Measurements: Goal: Ability to maintain a body temperature in the normal range will improve Outcome: Progressing   Problem: Respiratory: Goal: Ability to maintain adequate ventilation will improve Outcome: Progressing Goal: Ability to maintain a clear airway will improve Outcome: Progressing

## 2022-07-26 NOTE — Discharge Instructions (Signed)
Some PCP options in [place] area- not a comprehensive list  [facility]- [phone] [place]- [phone] [facility]- [phone] [facility]- [phone] [facility]- [phone] [place]- [phone]  or [place] [facility]erral [facility] [phone]  

## 2022-07-26 NOTE — TOC Transition Note (Signed)
Transition of Care [facility]) - Progression Note    Patient Details  Name: Logan Clark MRN: [id number] Date of Birth: [birth year]  [facility] CM/SW Contact  Garret Reddish, RN Phone Number: 07/26/2022, 1:12 PM  Clinical Narrative:   Chart reviewed.  Noted that patient was admitted for PNA.  Patient has been treated with IV Rocephin and Zithromax.   Noted that patient has orders for discharge today.  I have spoken with patient and he informs me that prior to admission he was independent of ADL's.  He reports that he currently has not insurance and no PCP.  I have informed her that I will place PCP resources on AVS.  I have also informed him that I have asked Pharmacy to assist with his discharge medications.  I have made pharmacy aware the he would be med management.  Pharmacy will deliver medications to the bedside prior to discharge.   PCP resources placed on AVS.  Patient reports that his friend will transport him home today.   I have informed staff nurse of the above information.       Barriers to Discharge: Inadequate or no insurance (Patient also has not PCP)  Expected Discharge Plan and Services         Expected Discharge Date: 07/26/22                         HH Arranged:  (Medication management)           Social Determinants of Health (SDOH) Interventions SDOH Screenings   Food Insecurity: No Food Insecurity (07/24/2022)  Housing: Low Risk  (07/24/2022)  Transportation Needs: No Transportation Needs (07/24/2022)  Utilities: Not At Risk (07/24/2022)  Depression (PHQ2-9): Low Risk  (12/11/2017)  Tobacco Use: High Risk (07/24/2022)    Readmission Risk Interventions     No data to display

## 2022-08-01 ENCOUNTER — Other Ambulatory Visit: Payer: Self-pay

## 2022-08-02 DIAGNOSIS — Z419 Encounter for procedure for purposes other than remedying health state, unspecified: Secondary | ICD-10-CM | POA: Diagnosis not present

## 2022-09-02 DIAGNOSIS — Z419 Encounter for procedure for purposes other than remedying health state, unspecified: Secondary | ICD-10-CM | POA: Diagnosis not present

## 2022-10-02 DIAGNOSIS — Z419 Encounter for procedure for purposes other than remedying health state, unspecified: Secondary | ICD-10-CM | POA: Diagnosis not present

## 2022-11-02 DIAGNOSIS — Z419 Encounter for procedure for purposes other than remedying health state, unspecified: Secondary | ICD-10-CM | POA: Diagnosis not present

## 2022-12-02 DIAGNOSIS — Z419 Encounter for procedure for purposes other than remedying health state, unspecified: Secondary | ICD-10-CM | POA: Diagnosis not present

## 2023-01-02 DIAGNOSIS — Z419 Encounter for procedure for purposes other than remedying health state, unspecified: Secondary | ICD-10-CM | POA: Diagnosis not present

## 2023-02-02 DIAGNOSIS — Z419 Encounter for procedure for purposes other than remedying health state, unspecified: Secondary | ICD-10-CM | POA: Diagnosis not present

## 2023-03-02 DIAGNOSIS — Z419 Encounter for procedure for purposes other than remedying health state, unspecified: Secondary | ICD-10-CM | POA: Diagnosis not present

## 2023-04-13 DIAGNOSIS — Z419 Encounter for procedure for purposes other than remedying health state, unspecified: Secondary | ICD-10-CM | POA: Diagnosis not present

## 2023-05-13 DIAGNOSIS — Z419 Encounter for procedure for purposes other than remedying health state, unspecified: Secondary | ICD-10-CM | POA: Diagnosis not present

## 2023-06-13 DIAGNOSIS — Z419 Encounter for procedure for purposes other than remedying health state, unspecified: Secondary | ICD-10-CM | POA: Diagnosis not present

## 2023-07-13 DIAGNOSIS — Z419 Encounter for procedure for purposes other than remedying health state, unspecified: Secondary | ICD-10-CM | POA: Diagnosis not present
